# Patient Record
Sex: Female | Born: 1950 | Race: White | Hispanic: No | Marital: Single | Smoking: Never smoker
Health system: Southern US, Community
[De-identification: ages and names within clinical notes are randomized; demographics above are authoritative.]

## PROBLEM LIST (undated history)

## (undated) DIAGNOSIS — E559 Vitamin D deficiency, unspecified: Secondary | ICD-10-CM

## (undated) DIAGNOSIS — M5134 Other intervertebral disc degeneration, thoracic region: Secondary | ICD-10-CM

## (undated) DIAGNOSIS — M5030 Other cervical disc degeneration, unspecified cervical region: Secondary | ICD-10-CM

## (undated) DIAGNOSIS — E669 Obesity, unspecified: Secondary | ICD-10-CM

## (undated) DIAGNOSIS — E785 Hyperlipidemia, unspecified: Secondary | ICD-10-CM

## (undated) DIAGNOSIS — M797 Fibromyalgia: Secondary | ICD-10-CM

## (undated) HISTORY — DX: Obesity, unspecified: E66.9

## (undated) HISTORY — DX: Other intervertebral disc degeneration, thoracic region: M51.34

## (undated) HISTORY — PX: ABDOMINAL HYSTERECTOMY: SHX81

## (undated) HISTORY — PX: SPINE SURGERY: SHX786

## (undated) HISTORY — DX: Hyperlipidemia, unspecified: E78.5

## (undated) HISTORY — DX: Vitamin D deficiency, unspecified: E55.9

## (undated) HISTORY — DX: Other cervical disc degeneration, unspecified cervical region: M50.30

## (undated) HISTORY — DX: Fibromyalgia: M79.7

---

## 1998-02-09 ENCOUNTER — Other Ambulatory Visit: Admission: RE | Admit: 1998-02-09 | Discharge: 1998-02-09 | Payer: Self-pay | Admitting: Gynecology

## 2000-06-02 ENCOUNTER — Other Ambulatory Visit: Admission: RE | Admit: 2000-06-02 | Discharge: 2000-06-02 | Payer: Self-pay | Admitting: Gynecology

## 2001-12-18 ENCOUNTER — Encounter: Payer: Self-pay | Admitting: Rheumatology

## 2001-12-18 ENCOUNTER — Ambulatory Visit (HOSPITAL_COMMUNITY): Admission: RE | Admit: 2001-12-18 | Discharge: 2001-12-18 | Payer: Self-pay | Admitting: Rheumatology

## 2002-02-27 ENCOUNTER — Inpatient Hospital Stay (HOSPITAL_COMMUNITY): Admission: AD | Admit: 2002-02-27 | Discharge: 2002-03-02 | Payer: Self-pay | Admitting: Neurosurgery

## 2002-02-27 ENCOUNTER — Encounter: Payer: Self-pay | Admitting: Neurosurgery

## 2002-06-23 ENCOUNTER — Ambulatory Visit (HOSPITAL_BASED_OUTPATIENT_CLINIC_OR_DEPARTMENT_OTHER): Admission: RE | Admit: 2002-06-23 | Discharge: 2002-06-23 | Payer: Self-pay | Admitting: Orthopedic Surgery

## 2005-11-05 ENCOUNTER — Encounter: Admission: RE | Admit: 2005-11-05 | Discharge: 2005-11-05 | Payer: Self-pay | Admitting: Otolaryngology

## 2005-11-24 ENCOUNTER — Encounter: Admission: RE | Admit: 2005-11-24 | Discharge: 2005-11-24 | Payer: Self-pay | Admitting: Rheumatology

## 2005-12-07 ENCOUNTER — Encounter: Admission: RE | Admit: 2005-12-07 | Discharge: 2005-12-07 | Payer: Self-pay | Admitting: Rheumatology

## 2008-01-03 ENCOUNTER — Encounter: Admission: RE | Admit: 2008-01-03 | Discharge: 2008-01-03 | Payer: Self-pay | Admitting: Rheumatology

## 2008-09-23 ENCOUNTER — Encounter: Admission: RE | Admit: 2008-09-23 | Discharge: 2008-09-23 | Payer: Self-pay | Admitting: Orthopedic Surgery

## 2008-12-07 ENCOUNTER — Encounter: Admission: RE | Admit: 2008-12-07 | Discharge: 2008-12-07 | Payer: Self-pay | Admitting: Orthopedic Surgery

## 2009-03-17 ENCOUNTER — Encounter: Admission: RE | Admit: 2009-03-17 | Discharge: 2009-03-17 | Payer: Self-pay | Admitting: Family Medicine

## 2009-10-25 ENCOUNTER — Encounter (INDEPENDENT_AMBULATORY_CARE_PROVIDER_SITE_OTHER): Payer: Self-pay | Admitting: *Deleted

## 2009-11-03 ENCOUNTER — Encounter (INDEPENDENT_AMBULATORY_CARE_PROVIDER_SITE_OTHER): Payer: Self-pay | Admitting: *Deleted

## 2009-11-06 ENCOUNTER — Ambulatory Visit: Payer: Self-pay | Admitting: Gastroenterology

## 2009-11-10 ENCOUNTER — Encounter: Payer: Self-pay | Admitting: Gastroenterology

## 2009-11-21 ENCOUNTER — Ambulatory Visit: Payer: Self-pay | Admitting: Gastroenterology

## 2010-09-12 ENCOUNTER — Encounter
Admission: RE | Admit: 2010-09-12 | Discharge: 2010-09-12 | Payer: Self-pay | Source: Home / Self Care | Attending: Family Medicine | Admitting: Family Medicine

## 2010-09-13 ENCOUNTER — Encounter: Admission: RE | Admit: 2010-09-13 | Payer: Self-pay | Source: Home / Self Care | Admitting: Rheumatology

## 2010-10-07 ENCOUNTER — Encounter: Payer: Self-pay | Admitting: Rheumatology

## 2010-10-16 NOTE — Letter (Signed)
Summary: Previsit letter  [facility]  [address] [place], [place] [address]   Phone: [phone]  Fax: [phone]       10/25/2009 MRN: [id number]  Bridget Hampton [address] [place], [place]  [address]  Dear [name],  Welcome to the Gastroenterology Division at [facility].    You are scheduled to see a nurse for your pre-procedure visit on 11-06-09 at 2:30p.m. on the 3rd floor at [facility], [address]  We ask that you try to arrive at our office 15 minutes prior to your appointment time to allow for check-in.  Your nurse visit will consist of discussing your medical and surgical history, your immediate family medical history, and your medications.    Please bring a complete list of all your medications or, if you prefer, bring the medication bottles and we will list them.  We will need to be aware of both prescribed and over the counter drugs.  We will need to know exact dosage information as well.  If you are on blood thinners (Coumadin, Plavix, Aggrenox, Ticlid, etc.) please call our office today/prior to your appointment, as we need to consult with your physician about holding your medication.   Please be prepared to read and sign documents such as consent forms, a financial agreement, and acknowledgement forms.  If necessary, and with your consent, a friend or relative is welcome to sit-in on the nurse visit with you.  Please bring your insurance card so that we may make a copy of it.  If your insurance requires a referral to see a specialist, please bring your referral form from your primary care physician.  No co-pay is required for this nurse visit.     If you cannot keep your appointment, please call [phone] to cancel or reschedule prior to your appointment date.  This allows [place] the opportunity to schedule an appointment for another patient in need of care.    Thank you for choosing [facility] for your medical needs.  We  appreciate the opportunity to care for you.  Please visit [place] at our website  to learn more about our practice.                     Sincerely.                                                                                                                   The Gastroenterology Division

## 2010-10-16 NOTE — Miscellaneous (Signed)
Summary: Previsit miralax prep  Clinical Lists Changes  Medications: Added new medication of MIRALAX   POWD (POLYETHYLENE GLYCOL 3350) As directed - Signed Added new medication of REGLAN 10 MG  TABS (METOCLOPRAMIDE HCL) As directed - Signed Added new medication of DULCOLAX 5 MG  TBEC (BISACODYL) As directed - Signed Rx of MIRALAX   POWD (POLYETHYLENE GLYCOL 3350) As directed;  #255 Gms x 0;  Signed;  Entered by: Clide Cliff RN;  Authorized by: Meryl Dare MD [facility];  Method used: Electronically to [facility]. #[id number]*, [address] [place], [place]  [address], Ph: [phone], Fax: [phone] Rx of REGLAN 10 MG  TABS (METOCLOPRAMIDE HCL) As directed;  #2 x 0;  Signed;  Entered by: Clide Cliff RN;  Authorized by: Meryl Dare MD [facility];  Method used: Electronically to [facility]. #[id number]*, [address], [place], [place]  [address], Ph: [phone], Fax: [phone] Rx of DULCOLAX 5 MG  TBEC (BISACODYL) As directed;  #4 x 0;  Signed;  Entered by: Clide Cliff RN;  Authorized by: Meryl Dare MD [facility];  Method used: Electronically to [facility]. #[id number]*, [address] [place], [place]  [address], Ph: [phone], Fax: [phone] Observations: Added new observation of ALLERGY REV: Done (11/10/2009 10:55)    Prescriptions: DULCOLAX 5 MG  TBEC (BISACODYL) As directed  #4 x 0   Entered by:   Clide Cliff RN   Authorized by:   Meryl Dare MD [facility]   Signed by:   Clide Cliff RN on 11/10/2009   Method used:   Electronically to        [facility] #[id number]* (retail)       [address] [place], [place]  [address]       Ph: [phone]       Fax: [phone]   RxID:   [id number] REGLAN 10 MG  TABS (METOCLOPRAMIDE HCL) As directed  #2 x 0   Entered by:   Clide Cliff RN   Authorized by:   Meryl Dare MD [facility]   Signed by:   Clide Cliff RN on 11/10/2009   Method used:   Electronically to        [facility] #[id number]* (retail)       [address] [place], [place]  [address]       Ph: [phone]       Fax: [phone]   RxID:   [id number] MIRALAX   POWD (POLYETHYLENE GLYCOL 3350) As directed  #255 Gms x 0   Entered by:   Clide Cliff RN   Authorized by:   Meryl Dare MD [facility]   Signed by:   Clide Cliff RN on 11/10/2009   Method used:   Electronically to        [facility] #[id number]* (retail)       [address] [place], [place]  [address]       Ph: [phone]       Fax: [phone]   RxID:   [id number]

## 2010-10-16 NOTE — Miscellaneous (Signed)
Summary: previsit/rm  Clinical Lists Changes  Medications: Added new medication of MOVIPREP 100 GM  SOLR (PEG-KCL-NACL-NASULF-NA ASC-C) As per prep instructions. - Signed Rx of MOVIPREP 100 GM  SOLR (PEG-KCL-NACL-NASULF-NA ASC-C) As per prep instructions.;  #1 x 0;  Signed;  Entered by: Sherren Kerns RN;  Authorized by: Meryl Dare MD [facility];  Method used: Electronically to [facility]. #[id number]*, [address], [place], [place]  [address], Ph: [phone], Fax: [phone] Observations: Added new observation of ALLERGY REV: Done (11/06/2009 14:11) Added new observation of NKA: T (11/06/2009 14:11)    Prescriptions: MOVIPREP 100 GM  SOLR (PEG-KCL-NACL-NASULF-NA ASC-C) As per prep instructions.  #1 x 0   Entered by:   Sherren Kerns RN   Authorized by:   Meryl Dare MD [facility]   Signed by:   Sherren Kerns RN on 11/06/2009   Method used:   Electronically to        [facility] #[id number]* (retail)       [address] [place], [place]  [address]       Ph: [phone]       Fax: [phone]   RxID:   [id number]

## 2010-10-16 NOTE — Procedures (Signed)
Summary: Colonoscopy  Patient: [name] Note: All result statuses are Final unless otherwise noted.  Tests: (1) Colonoscopy (COL)   COL Colonoscopy           DONE     [facility]     [address]     [place], [place]  [address]           COLONOSCOPY PROCEDURE REPORT           PATIENT:  Bridget Hampton, Bridget Hampton  MR#:  [id number]     BIRTHDATE:  [birth year], 58 yrs. old  GENDER:  female           ENDOSCOPIST:  Judie Petit T. Russella Dar, MD, [facility]           PROCEDURE DATE:  11/21/2009     PROCEDURE:  Colonoscopy 19147     ASA CLASS:  Class II     INDICATIONS:  1) change in bowel habits           MEDICATIONS:   Fentanyl 75 mcg IV, Versed 7 mg IV           DESCRIPTION OF PROCEDURE:   After the risks benefits and     alternatives of the procedure were thoroughly explained, informed     consent was obtained.  Digital rectal exam was performed and     revealed no abnormalities.   The LB [id number]-[id number] endoscope     was introduced through the anus and advanced to the cecum, which     was identified by both the appendix and ileocecal valve, without     limitations.  The quality of the prep was good, using one liter of     MoviPrep and a full Miralax prep.  The instrument was then slowly     withdrawn as the colon was fully examined.     <<PROCEDUREIMAGES>>           FINDINGS:  A normal appearing cecum, ileocecal valve, and     appendiceal orifice were identified. The ascending, hepatic     flexure, transverse, splenic flexure, descending, sigmoid colon,     and rectum appeared unremarkable. Retroflexed views in the rectum     revealed no abnormalities. The time to cecum =  2.75  minutes. The     scope was then withdrawn (time =  13.33  min) from the patient and     the procedure completed.           COMPLICATIONS:  None           ENDOSCOPIC IMPRESSION:     1) Normal colon           RECOMMENDATIONS:     1) Continue current colorectal screening recommendations for     "routine  risk" patients with a repeat colonoscopy in 10 years.           Venita Lick. Russella Dar, MD, [facility]           CC: Ace Gins, MD           n.     Rosalie DoctorVenita Lick. Serria Sloma at 11/21/2009 02:54 PM           Sophronia Simas, [phone]  Note: An exclamation mark (!) indicates a result that was not dispersed into the flowsheet. Document Creation Date: 11/21/2009 2:55 PM _______________________________________________________________________  (1) Order result status: Final Collection or observation date-time: 11/21/2009 14:46 Requested date-time:  Receipt date-time:  Reported date-time:  Referring Physician:  Ordering Physician: Claudette Head [phone]) Specimen Source:  Source: Launa Grill Order Number: [id number] Lab site:   Appended Document: Colonoscopy    Clinical Lists Changes  Observations: Added new observation of COLONNXTDUE: 11/2019 (11/21/2009 16:06)

## 2010-10-16 NOTE — Letter (Signed)
Summary: [facility] Instructions  [name]  [address] [place], [place] [address]   Phone: [phone]  Fax: [phone]       Bridget Hampton "Tanganyika"    1951-01-16    MRN: [id number]        Procedure Day [name]:  Farrell Ours  11/10/09     Arrival Time:  10:00AM     Procedure Time:  11:00AM     Location of Procedure:                    _ X_  [facility] (4th Floor)                        PREPARATION FOR COLONOSCOPY WITH MOVIPREP   Starting 5 days prior to your procedure 11/05/09 today do not eat nuts, seeds, popcorn, corn, beans, peas,  salads, or any raw vegetables.  Do not take any fiber supplements (e.g. Metamucil, Citrucel, and Benefiber).  THE DAY BEFORE YOUR PROCEDURE         DATE: 11/09/09  DAY: THURSDAY  1.  Drink clear liquids the entire day-NO SOLID FOOD  2.  Do not drink anything colored red or purple.  Avoid juices with pulp.  No orange juice.  3.  Drink at least 64 oz. (8 glasses) of fluid/clear liquids during the day to prevent dehydration and help the prep work efficiently.  CLEAR LIQUIDS INCLUDE: Water Jello Ice Popsicles Tea (sugar ok, no milk/cream) Powdered fruit flavored drinks Coffee (sugar ok, no milk/cream) Gatorade Juice: apple, white grape, white cranberry  Lemonade Clear bullion, consomm, broth Carbonated beverages (any kind) Strained chicken noodle soup Hard Candy                             4.  In the morning, mix first dose of MoviPrep solution:    Empty 1 Pouch A and 1 Pouch B into the disposable container    Add lukewarm drinking water to the top line of the container. Mix to dissolve    Refrigerate (mixed solution should be used within 24 hrs)  5.  Begin drinking the prep at 5:00 p.m. The MoviPrep container is divided by 4 marks.   Every 15 minutes drink the solution down to the next mark (approximately 8 oz) until the full liter is complete.   6.  Follow completed prep with 16 oz of clear liquid of your  choice (Nothing red or purple).  Continue to drink clear liquids until bedtime.  7.  Before going to bed, mix second dose of MoviPrep solution:    Empty 1 Pouch A and 1 Pouch B into the disposable container    Add lukewarm drinking water to the top line of the container. Mix to dissolve    Refrigerate  THE DAY OF YOUR PROCEDURE      DATE: 11/10/09  DAY: FRIDAY  Beginning at 6:00AM (5 hours before procedure):         1. Every 15 minutes, drink the solution down to the next mark (approx 8 oz) until the full liter is complete.  2. Follow completed prep with 16 oz. of clear liquid of your choice.    3. You may drink clear liquids until 9:00AM (2 HOURS BEFORE PROCEDURE).   MEDICATION INSTRUCTIONS  Unless otherwise instructed, you should take regular prescription medications with a small sip of water   as early as possible  the morning of your procedure.    Additional medication instructions:  n/a         OTHER INSTRUCTIONS  You will need a responsible adult at least 60 years of age to accompany you and drive you home.   This person must remain in the waiting room during your procedure.  Wear loose fitting clothing that is easily removed.  Leave jewelry and other valuables at home.  However, you may wish to bring a book to read or  an iPod/MP3 player to listen to music as you wait for your procedure to start.  Remove all body piercing jewelry and leave at home.  Total time from sign-in until discharge is approximately 2-3 hours.  You should go home directly after your procedure and rest.  You can resume normal activities the  day after your procedure.  The day of your procedure you should not:   Drive   Make legal decisions   Operate machinery   Drink alcohol   Return to work  You will receive specific instructions about eating, activities and medications before you leave.    The above instructions have been reviewed and explained to me by  Sherren Kerns RN   November 06, 2009 2:37 PM      I fully understand and can verbalize these instructions _____________________________ Date _________

## 2011-01-08 ENCOUNTER — Other Ambulatory Visit: Payer: Self-pay | Admitting: Neurosurgery

## 2011-01-08 DIAGNOSIS — M542 Cervicalgia: Secondary | ICD-10-CM

## 2011-01-10 ENCOUNTER — Ambulatory Visit
Admission: RE | Admit: 2011-01-10 | Discharge: 2011-01-10 | Disposition: A | Discharge status: Home / Self Care | Payer: PRIVATE HEALTH INSURANCE | Source: Ambulatory Visit | Attending: Neurosurgery | Admitting: Neurosurgery

## 2011-01-10 DIAGNOSIS — M542 Cervicalgia: Secondary | ICD-10-CM

## 2011-02-01 NOTE — Op Note (Signed)
[facility]  Patient:    Bridget Hampton, Bridget Hampton Visit Number: [id number] MRN: [id number]          Service Type: SUR Location: [id number] Attending Physician:  Josie Saunders Dictated by:   Danae Orleans Venetia Maxon, M.D. Proc. 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  POSTOPERATIVE DIAGNOSIS:  Herniated cervical disk with cervical spondylosis and degenerative disk disease and radiculopathy at the C5-6 level.  OPERATION PERFORMED:  Anterior cervical diskectomy and fusion, C5-6 with allograft, bone graft and anterior cervical plate.  SURGEON:  Danae Orleans. Venetia Maxon, M.D.  ASSISTANT:  Clydene Fake, M.D.  ANESTHESIA:  General endotracheal.  ESTIMATED BLOOD LOSS:  Minimal.  COMPLICATIONS:  None.  DISPOSITION:  Recovery.  INDICATIONS FOR PROCEDURE:  The patient is a 60 year old woman with a herniated disk which has significantly degenerated with bilateral spondylitic foraminal narrowing at the C5-6 level.  It was elected to take her to surgery for anterior cervical diskectomy and fusion.  DESCRIPTION OF PROCEDURE:  The patient was brought to the operating room. Following satisfactory and uncomplicated induction of general endotracheal anesthesia and placement of intravenous lines, the patient was placed in supine position on the operating table.  Her neck was placed in slight extension.  She was placed on horseshoe head holder in 10 pounds of halter traction.  Her anterior neck was then prepped and draped in the usual sterile fashion.  The area of planned incision was infiltrated with 0.25% Marcaine and 0.5% lidocaine with 1:200,000 epinephrine.  Incision was made from the midline to the anterior border of the sternocleidomastoid muscle.  Through platysma layer,  subplatysmal dissection was performed and then blunt dissection was performed keeping the carotid sheath lateral and trachea and esophagus medial exposing the anterior cervical spine.  The disk space which was felt to correspond to the C5-6 level was identified and the spinal needle was placed at this level. Intraoperative x-ray confirmed correct level.  Subsequently the longus colli muscles were taken down bilaterally from C5 to C6 using electrocautery and Key elevator and the Shadowline retractor was placed.  The disk space was incised.  Ventral osteophytes were removed with Leksell rongeur.  The interspace was severely degenerated.  There was minimal remaining disk.  The disk space spreaders were placed and subsequently the end plates were decorticated using Anspach drill and A2 equivalent bur. The large uncinate spurs were drilled down bilaterally and the C6 nerve roots were decompressed as they extended out the neural foramina.  This was done with removal of the posterior longitudinal ligament and remaining prominent osteophytes.  The spinal cord dura and C6 nerve roots were well decompressed. Hemostasis was assured with Gelfoam soaked in thrombin and 8 mm tricortical iliac crest bone graft was then cut to a depth of 12 mm and inserted in the interspace after sizing and prior to choosing the appropriate thickness bone graft.  The halter traction was then removed using reflex anterior cervical plate.  An 18 mm plate was affixed to the anterior cervical spine with two 12 mm screws at C5 and two 12 mm screws at C6.  All screws had excellent purchase and engaged the plate well.  Final x-ray confirmed positioning of  bone graft and anterior cervical plate.  The wound was copiously irrigated with Bacitracin saline after removal of the retractor.  Hemostasis was assured.  Platysma layer was closed with 3-0 Vicryl sutures and skin edges were reapproximated with running 4-0 Vicryl suture  subcuticular stitch.  The wound was dressed with Benzoin and Steri-Strips and Telfa gauze and tape.  The patient was extubated in the operating room and taken to the recovery room in stable and satisfactory condition having tolerated the procedure well. Dictated by:   Danae Orleans Venetia Maxon, M.D. Attending Physician:  Josie Saunders DD:  02/27/02 TD:  03/01/02 Job: [id number]

## 2011-02-01 NOTE — Op Note (Signed)
NAME:  Bridget Hampton, Bridget Hampton                          ACCOUNT NO.:  [id number]   MEDICAL RECORD NO.:  [id number]                   PATIENT TYPE:  AMB   LOCATION:  DSC                                  FACILITY:  [facility]   PHYSICIAN:  Mila Homer. Sherlean Foot, M.D.              DATE OF BIRTH:  [birth year]   DATE OF PROCEDURE:  06/23/2002  DATE OF DISCHARGE:                                 OPERATIVE REPORT   PREOPERATIVE DIAGNOSIS:  Right shoulder impingement syndrome.   POSTOPERATIVE DIAGNOSIS:  Right shoulder impingement syndrome.   PROCEDURE:  Right shoulder arthroscopy with subacromial decompression.   SURGEON:  Mila Homer. Sherlean Foot, M.D.   COMPLICATIONS:  None.   DRAINS:  None.   INDICATION FOR PROCEDURE:  The patient is a 60 year old white female status  post conservative measures for shoulder impingement syndrome, MRI evidence  of partial-thickness tearing of the rotator cuff.  Informed consent was  obtained.   DESCRIPTION OF PROCEDURE:  The patient was placed supine on the operating  room table, administered general LMA anesthesia after an interscalene block  in the preanesthesia holding area.  The right shoulder was prepped and  draped in the usual sterile fashion.  The patient was in the beach chair  position.  A posterior portal was created with a #11 blade, blunt trocar,  and cannula.  Glenohumeral arthroscopy revealed no chondromalacia in the  glenohumeral joint, normal labrum, normal biceps anchor, superior and  inferior middle glenohumeral ligament.  The bare area of the humerus showed  an intact rotator cuff but some minor partial-thickness tearing of the  supraspinatus insertion.  At this point I went into the subacromial space  from posterior and made a direct lateral portal with a #11 blade, blunt  trocar, and cannula.  I performed a bursectomy, an anterior acromioplasty,  and a CA ligament release.  Once I had done this, I evaluated the rotator  cuff.  There was a significant  amount of fraying and partial-thickness  tearing but nothing close to a full-thickness tear.  The clavicle did not  seem to have any large osteophytes, so I went and did the acromioplasty over  to the clavicle but did not violate the [facility] joint.  The 30-30 test showed  that adequate resection of bone had been performed.  At this point I  evacuated the subacromial space, closed with interrupted 4-0 nylon sutures,  dressed with Adaptic, 4 x 4's, and ABDs and two-inch silk tape and a simple  sling.  The patient tolerated her procedure well.                                               Mila Homer. Sherlean Foot, M.D.    SDL/MEDQ  D:  06/23/2002  T:  06/24/2002  Job:  [id number]  

## 2011-02-01 NOTE — Discharge Summary (Signed)
   NAME:  [name], [name] NO.:  [id number]   MEDICAL RECORD NO.:  [id number]                   PATIENT TYPE:   LOCATION:                                       FACILITY:   PHYSICIAN:  Danae Orleans. Venetia Maxon, M.D.               DATE OF BIRTH:  [birth year]   DATE OF ADMISSION:  02/27/2002  DATE OF DISCHARGE:  03/02/2002                                 DISCHARGE SUMMARY   REASON FOR ADMISSION:  Herniated cervical disk.   ADDITIONAL DIAGNOSES:  1. Urinary retention.  2. Cervical disk degenerative disease.  3. Esophageal reflux.  4. Cervical spondylosis.   FINAL DIAGNOSES:  1. Herniated cervical disk.  2. Urinary retention.  3. Cervical disk degenerative disease.  4. Esophageal reflux.  5. Cervical spondylosis.   HISTORY OF ILLNESS AND HOSPITAL COURSE:  The patient is a 60 year old woman  with a herniated cervical disk and cervical radiculopathy at C5-6 level.  She was admitted to the hospital on the same-day-as-procedure basis on February 27, 2002 and at that point, underwent uncomplicated anterior cervical  diskectomy and fusion.  Postoperatively, she had some difficulty with  urinary retention, had abdominal distention and had a catheterization for  900 cc of urine.  She was kept in the hospital with bladder rest and  eventually was doing well on the 17th and was discharged at that point,  voiding without difficulty and with instructions to follow up in four weeks  with discharge medications of Vicodin and Valium, her condition improved and  final diagnoses same.                                                 Danae Orleans. Venetia Maxon, M.D.    JDS/MEDQ  D:  07/16/2002  T:  07/18/2002  Job:  [id number]

## 2013-05-18 ENCOUNTER — Other Ambulatory Visit (HOSPITAL_BASED_OUTPATIENT_CLINIC_OR_DEPARTMENT_OTHER): Payer: Self-pay | Admitting: Rheumatology

## 2013-05-18 DIAGNOSIS — IMO0002 Reserved for concepts with insufficient information to code with codable children: Secondary | ICD-10-CM

## 2013-05-22 ENCOUNTER — Ambulatory Visit (HOSPITAL_BASED_OUTPATIENT_CLINIC_OR_DEPARTMENT_OTHER)
Admission: RE | Admit: 2013-05-22 | Discharge: 2013-05-22 | Disposition: A | Discharge status: Home / Self Care | Payer: 59 | Source: Ambulatory Visit | Attending: Rheumatology | Admitting: Rheumatology

## 2013-05-22 DIAGNOSIS — M545 Low back pain, unspecified: Secondary | ICD-10-CM | POA: Insufficient documentation

## 2013-05-22 DIAGNOSIS — M5126 Other intervertebral disc displacement, lumbar region: Secondary | ICD-10-CM | POA: Insufficient documentation

## 2013-05-22 DIAGNOSIS — IMO0002 Reserved for concepts with insufficient information to code with codable children: Secondary | ICD-10-CM

## 2013-05-22 DIAGNOSIS — R209 Unspecified disturbances of skin sensation: Secondary | ICD-10-CM | POA: Insufficient documentation

## 2013-05-22 DIAGNOSIS — M4120 Other idiopathic scoliosis, site unspecified: Secondary | ICD-10-CM | POA: Insufficient documentation

## 2013-05-22 DIAGNOSIS — R29898 Other symptoms and signs involving the musculoskeletal system: Secondary | ICD-10-CM | POA: Insufficient documentation

## 2014-10-25 ENCOUNTER — Other Ambulatory Visit (HOSPITAL_COMMUNITY): Payer: Self-pay | Admitting: Rheumatology

## 2014-10-25 DIAGNOSIS — M545 Low back pain: Secondary | ICD-10-CM

## 2015-11-16 ENCOUNTER — Other Ambulatory Visit: Payer: Self-pay | Admitting: Orthopedic Surgery

## 2015-11-16 DIAGNOSIS — M542 Cervicalgia: Secondary | ICD-10-CM

## 2015-11-29 ENCOUNTER — Other Ambulatory Visit: Payer: Self-pay

## 2015-12-09 ENCOUNTER — Ambulatory Visit
Admission: RE | Admit: 2015-12-09 | Discharge: 2015-12-09 | Disposition: A | Discharge status: Home / Self Care | Payer: 59 | Source: Ambulatory Visit | Attending: Orthopedic Surgery | Admitting: Orthopedic Surgery

## 2015-12-09 DIAGNOSIS — M542 Cervicalgia: Secondary | ICD-10-CM

## 2015-12-16 ENCOUNTER — Ambulatory Visit
Admission: RE | Admit: 2015-12-16 | Discharge: 2015-12-16 | Disposition: A | Discharge status: Home / Self Care | Payer: 59 | Source: Ambulatory Visit | Attending: Orthopedic Surgery | Admitting: Orthopedic Surgery

## 2016-01-17 ENCOUNTER — Encounter: Payer: Self-pay | Admitting: Gastroenterology

## 2016-11-04 ENCOUNTER — Telehealth: Payer: Self-pay | Admitting: Adult Health

## 2016-11-04 ENCOUNTER — Encounter: Payer: Self-pay | Admitting: Adult Health

## 2016-11-04 ENCOUNTER — Ambulatory Visit (INDEPENDENT_AMBULATORY_CARE_PROVIDER_SITE_OTHER): Payer: 59 | Admitting: Adult Health

## 2016-11-04 DIAGNOSIS — E782 Mixed hyperlipidemia: Secondary | ICD-10-CM | POA: Diagnosis not present

## 2016-11-04 DIAGNOSIS — M549 Dorsalgia, unspecified: Secondary | ICD-10-CM | POA: Insufficient documentation

## 2016-11-04 DIAGNOSIS — R42 Dizziness and giddiness: Secondary | ICD-10-CM | POA: Insufficient documentation

## 2016-11-04 DIAGNOSIS — E559 Vitamin D deficiency, unspecified: Secondary | ICD-10-CM

## 2016-11-04 DIAGNOSIS — R112 Nausea with vomiting, unspecified: Secondary | ICD-10-CM | POA: Insufficient documentation

## 2016-11-04 DIAGNOSIS — E785 Hyperlipidemia, unspecified: Secondary | ICD-10-CM | POA: Insufficient documentation

## 2016-11-04 DIAGNOSIS — M797 Fibromyalgia: Secondary | ICD-10-CM

## 2016-11-04 DIAGNOSIS — M5430 Sciatica, unspecified side: Secondary | ICD-10-CM | POA: Insufficient documentation

## 2016-11-04 DIAGNOSIS — M546 Pain in thoracic spine: Secondary | ICD-10-CM

## 2016-11-04 DIAGNOSIS — M5432 Sciatica, left side: Secondary | ICD-10-CM

## 2016-11-04 DIAGNOSIS — Z6836 Body mass index (BMI) 36.0-36.9, adult: Secondary | ICD-10-CM

## 2016-11-04 DIAGNOSIS — M5431 Sciatica, right side: Secondary | ICD-10-CM

## 2016-11-04 MED ORDER — ONDANSETRON HCL 8 MG PO TABS: 4.0000 mg | ORAL_TABLET | Freq: Three times a day (TID) | ORAL | 0 refills | Status: AC | PRN

## 2016-11-04 NOTE — Assessment & Plan Note (Signed)
Reduce saturated fat intake. 

## 2016-11-04 NOTE — Assessment & Plan Note (Signed)
Increase fluids and rest. Provided work excuse for 48 hrs.

## 2016-11-04 NOTE — Patient Instructions (Addendum)
Food Choices to Help Relieve Diarrhea, Adult When you have diarrhea, the foods you eat and your eating habits are very important. Choosing the right foods and drinks can help relieve diarrhea. Also, because diarrhea can last up to 7 days, you need to replace lost fluids and electrolytes (such as sodium, potassium, and chloride) in order to help prevent dehydration. What general guidelines do I need to follow?  Slowly drink 1 cup (8 oz) of fluid for each episode of diarrhea. If you are getting enough fluid, your urine will be clear or pale yellow.  Eat starchy foods. Some good choices include white rice, white toast, pasta, low-fiber cereal, baked potatoes (without the skin), saltine crackers, and bagels.  Avoid large servings of any cooked vegetables.  Limit fruit to two servings per day. A serving is  cup or 1 small piece.  Choose foods with less than 2 g of fiber per serving.  Limit fats to less than 8 tsp (38 g) per day.  Avoid fried foods.  Eat foods that have probiotics in them. Probiotics can be found in certain dairy products.  Avoid foods and beverages that may increase the speed at which food moves through the stomach and intestines (gastrointestinal tract). Things to avoid include:  High-fiber foods, such as dried fruit, raw fruits and vegetables, nuts, seeds, and whole grain foods.  Spicy foods and high-fat foods.  Foods and beverages sweetened with high-fructose corn syrup, honey, or sugar alcohols such as xylitol, sorbitol, and mannitol. What foods are recommended? Grains  White rice. White, French, or pita breads (fresh or toasted), including plain rolls, buns, or bagels. White pasta. Saltine, soda, or graham crackers. Pretzels. Low-fiber cereal. Cooked cereals made with water (such as cornmeal, farina, or cream cereals). Plain muffins. Matzo. Melba toast. Zwieback. Vegetables  Potatoes (without the skin). Strained tomato and vegetable juices. Most well-cooked and canned  vegetables without seeds. Tender lettuce. Fruits  Cooked or canned applesauce, apricots, cherries, fruit cocktail, grapefruit, peaches, pears, or plums. Fresh bananas, apples without skin, cherries, grapes, cantaloupe, grapefruit, peaches, oranges, or plums. Meat and Other Protein Products  Baked or boiled chicken. Eggs. Tofu. Fish. Seafood. Smooth peanut butter. Ground or well-cooked tender beef, ham, veal, lamb, pork, or poultry. Dairy  Plain yogurt, kefir, and unsweetened liquid yogurt. Lactose-free milk, buttermilk, or soy milk. Plain hard cheese. Beverages  Sport drinks. Clear broths. Diluted fruit juices (except prune). Regular, caffeine-free sodas such as ginger ale. Water. Decaffeinated teas. Oral rehydration solutions. Sugar-free beverages not sweetened with sugar alcohols. Other  Bouillon, broth, or soups made from recommended foods. The items listed above may not be a complete list of recommended foods or beverages. Contact your dietitian for more options.  What foods are not recommended? Grains  Whole grain, whole wheat, bran, or rye breads, rolls, pastas, crackers, and cereals. Wild or brown rice. Cereals that contain more than 2 g of fiber per serving. Corn tortillas or taco shells. Cooked or dry oatmeal. Granola. Popcorn. Vegetables  Raw vegetables. Cabbage, broccoli, Brussels sprouts, artichokes, baked beans, beet greens, corn, kale, legumes, peas, sweet potatoes, and yams. Potato skins. Cooked spinach and cabbage. Fruits  Dried fruit, including raisins and dates. Raw fruits. Stewed or dried prunes. Fresh apples with skin, apricots, mangoes, pears, raspberries, and strawberries. Meat and Other Protein Products  Chunky peanut butter. Nuts and seeds. Beans and lentils. Bacon. Dairy  High-fat cheeses. Milk, chocolate milk, and beverages made with milk, such as milk shakes. Cream. Ice cream. Sweets and Desserts    Sweet rolls, doughnuts, and sweet breads. Pancakes and waffles. Fats  and Oils  Butter. Cream sauces. Margarine. Salad oils. Plain salad dressings. Olives. Avocados. Beverages  Caffeinated beverages (such as coffee, tea, soda, or energy drinks). Alcoholic beverages. Fruit juices with pulp. Prune juice. Soft drinks sweetened with high-fructose corn syrup or sugar alcohols. Other  Coconut. Hot sauce. Chili powder. Mayonnaise. Gravy. Cream-based or milk-based soups. The items listed above may not be a complete list of foods and beverages to avoid. Contact your dietitian for more information.  What should I do if I become dehydrated? Diarrhea can sometimes lead to dehydration. Signs of dehydration include dark urine and dry mouth and skin. If you think you are dehydrated, you should rehydrate with an oral rehydration solution. These solutions can be purchased at pharmacies, retail stores, or online. Drink -1 cup (120-240 mL) of oral rehydration solution each time you have an episode of diarrhea. If drinking this amount makes your diarrhea worse, try drinking smaller amounts more often. For example, drink 1-3 tsp (5-15 mL) every 5-10 minutes. A general rule for staying hydrated is to drink 1-2 L of fluid per day. Talk to your health care provider about the specific amount you should be drinking each day. Drink enough fluids to keep your urine clear or pale yellow. This information is not intended to replace advice given to you by your health care provider. Make sure you discuss any questions you have with your health care provider. Document Released: 11/23/2003 Document Revised: 02/08/2016 Document Reviewed: 07/26/2013 Elsevier Interactive Patient Education  2017 Else[employer] Inc. Nausea, Adult Nausea is the feeling of an upset stomach or having to vomit. Nausea on its own is not usually a serious concern, but it may be an early sign of a more serious medical problem. As nausea gets worse, it can lead to vomiting. If vomiting develops, or if you are not able to drink enough  fluids, you are at risk of becoming dehydrated. Dehydration can make you tired and thirsty, cause you to have a dry mouth, and decrease how often you urinate. Older adults and people with other diseases or a weak immune system are at higher risk for dehydration. The main goals of treating your nausea are:  To limit repeated nausea episodes.  To prevent vomiting and dehydration. Follow these instructions at home: Follow instructions from your health care provider about how to care for yourself at home. Eating and drinking Follow these recommendations as told by your health care provider:  Take an oral rehydration solution (ORS). This is a drink that is sold at pharmacies and retail stores.  Drink clear fluids in small amounts as you are able. Clear fluids include water, ice chips, diluted fruit juice, and low-calorie sports drinks.  Eat bland, easy-to-digest foods in small amounts as you are able. These foods include bananas, applesauce, rice, lean meats, toast, and crackers.  Avoid drinking fluids that contain a lot of sugar or caffeine, such as energy drinks, sports drinks, and soda.  Avoid alcohol.  Avoid spicy or fatty foods. General instructions  Drink enough fluid to keep your urine clear or pale yellow.  Wash your hands often. If soap and water are not available, use hand sanitizer.  Make sure that all people in your household wash their hands well and often.  Rest at home while you recover.  Take over-the-counter and prescription medicines only as told by your health care provider.  Breathe slowly and deeply when you feel nauseous.  Watch your  condition for any changes.  Keep all follow-up visits as told by your health care provider. This is important. Contact a health care provider if:  You have a headache.  You have new symptoms.  Your nausea gets worse.  You have a fever.  You feel light-headed or dizzy.  You vomit.  You cannot keep fluids down. Get help  right away if:  You have pain in your chest, neck, arm, or jaw.  You feel extremely weak or you faint.  You have vomit that is bright red or looks like coffee grounds.  You have bloody or black stools or stools that look like tar.  You have a severe headache, a stiff neck, or both.  You have severe pain, cramping, or bloating in your abdomen.  You have a rash.  You have difficulty breathing or are breathing very quickly.  Your heart is beating very quickly.  Your skin feels cold and clammy.  You feel confused.  You have pain when you urinate.  You have signs of dehydration, such as:  Dark urine, very little, or no urine.  Cracked lips.  Dry mouth.  Sunken eyes.  Sleepiness.  Weakness. These symptoms may represent a serious problem that is an emergency. Do not wait to see if the symptoms will go away. Get medical help right away. Call your local emergency services (911 in the [place] Do not drive yourself to the hospital.  This information is not intended to replace advice given to you by your health care provider. Make sure you discuss any questions you have with your health care provider. Document Released: 10/10/2004 Document Revised: 02/05/2016 Document Reviewed: 05/09/2015 Elsevier Interactive Patient Education  2017 Elsevier Inc.  Rest, increase fluids and easily digestable foods. Use Ondansetron every 8 hrs as needed for nausea/vomiting. Please remain home for 48 hrs. Please return Thursday am for fasting labs and let us know how you are feeling. If sx's persist, GI referral will be placed. For regular Follow-up, once a year or sooner if needed.

## 2016-11-04 NOTE — Assessment & Plan Note (Signed)
Reduce saturated fat/sugar/CHO intake. Increase water/fruits/vegetable intake. Increase regular exercise.

## 2016-11-04 NOTE — Assessment & Plan Note (Signed)
Continue with Fibromyalgia specialist as directed.

## 2016-11-04 NOTE — Telephone Encounter (Signed)
Liza from  [facility] clld to say they need Rx prescription for Zocor sent to them for Toll BrothersJoann Ruffino. --glh

## 2016-11-04 NOTE — Progress Notes (Signed)
Subjective:    Patient ID: Bridget Hampton, female    DOB: 24-Feb-1951, 66 y.o.   MRN: [id number]  HPI:  Bridget Hampton presents to establish a new pt.  She has hx of back pain, bil sciatica, fibromyalgia, hyperlipidemia, and obesity.   She reports that 1 month ago thoracic back pain that radiates to both arms began.  She denies acute trauma prior to onset of pain, that is a constant dull ache that is 6/10-9/10.  She reports that's applying biofreeze provide minimal, temporary relief.   She also reports new onset of fever/N/V/D that started about 5 days ago. She denies recent travel outside [place] or eating anything unusual prior to GI upset.  She feels dizzy and believes it is r/t to her inability to keep "anything down".  She denies CP/dyspnea at rest, however will get "winded when working" at [employer]  Also her right eye became reddened and tender to the touch over the weekend.She has not seen PCP in years and only medical care recently is bi-annual visits with a Fibromyalgia specialist.   Patient Care Team    Relationship Specialty Notifications Start End  Malon Kindle, NP PCP - General Family Medicine  10/07/16     Patient Active Problem List   Diagnosis Date Noted  . Nausea & vomiting 11/04/2016  . Dizzinesses 11/04/2016  . Back pain 11/04/2016  . Sciatica 11/04/2016  . BMI 36.0-36.9,adult 11/04/2016  . Vitamin D deficiency   . Hyperlipidemia   . Fibromyalgia      Past Medical History:  Diagnosis Date  . Fibromyalgia   . Hyperlipidemia   . Vitamin D deficiency      Past Surgical History:  Procedure Laterality Date  . ABDOMINAL HYSTERECTOMY    . SPINE SURGERY     2015     Family History  Problem Relation Age of Onset  . Congestive Heart Failure Mother   . Valvular heart disease Mother   . Alcohol abuse Father   . Cancer Sister     Breast and Colon  . Cancer Maternal Uncle     lung  . Heart attack Maternal Grandmother   . Stroke Maternal Grandfather      History    Drug Use No     History  Alcohol Use No     History  Smoking Status  . Never Smoker  Smokeless Tobacco  . Never Used     Outpatient Encounter Prescriptions as of 11/04/2016  Medication Sig  . acetaminophen (TYLENOL) 325 MG tablet Take 650 mg by mouth every 6 (six) hours as needed.  Marland Kitchen ibuprofen (ADVIL,MOTRIN) 200 MG tablet Take 200 mg by mouth every 8 (eight) hours as needed.  . Menthol, Topical Analgesic, (BIOFREEZE EX) Apply topically.  . traMADol (ULTRAM) 50 MG tablet Take 50 mg by mouth every 6 (six) hours as needed.  . ondansetron (ZOFRAN) 8 MG tablet Take 0.5 tablets (4 mg total) by mouth every 8 (eight) hours as needed for nausea or vomiting.   No facility-administered encounter medications on file as of 11/04/2016.     Allergies: Patient has no known allergies.  Body mass index is 36.85 kg/m.  Blood pressure 112/74, pulse 76, resp. rate 16, height 5' 3.75" (1.619 m), weight 213 lb (96.6 kg), SpO2 98 %.     Review of Systems  Constitutional: Positive for activity change, appetite change, fatigue and fever. Negative for chills, diaphoresis and unexpected weight change.  HENT: Negative for congestion and postnasal drip.  Eyes: Positive for pain and redness. Negative for visual disturbance.       Right Eye-reddened, tender to the touch that began over weekend. She does not wear contact lenses.  Respiratory: Positive for shortness of breath. Negative for cough, wheezing and stridor.        Dyspnea with exertion.  Denies tobacco use.  Cardiovascular: Negative for chest pain, palpitations and leg swelling.  Gastrointestinal: Positive for diarrhea, nausea and vomiting. Negative for abdominal distention, abdominal pain, blood in stool, constipation and rectal pain.  Endocrine: Negative for cold intolerance, heat intolerance, polydipsia, polyphagia and polyuria.  Genitourinary: Negative for difficulty urinating and flank pain.  Musculoskeletal: Positive for  arthralgias, back pain, gait problem, neck pain and neck stiffness. Negative for joint swelling.  Skin: Negative for color change, pallor, rash and wound.  Neurological: Positive for dizziness. Negative for tremors, weakness and headaches.       Objective:   Physical Exam  Constitutional: She is oriented to person, place, and time. She appears well-developed and well-nourished.  Appears dizzy, however steady.  HENT:  Head: Normocephalic and atraumatic.  Right Ear: External ear normal.  Left Ear: External ear normal.  Eyes: Lids are normal. Pupils are equal, round, and reactive to light. Right eye exhibits no exudate and no hordeolum. No foreign body present in the right eye. Right conjunctiva has a hemorrhage. Left conjunctiva has no hemorrhage. Right eye exhibits normal extraocular motion and no nystagmus. Left eye exhibits normal extraocular motion and no nystagmus.    Neck: Normal range of motion. Neck supple.  Cardiovascular: Normal rate, regular rhythm, normal heart sounds and intact distal pulses.   Pulmonary/Chest: Effort normal and breath sounds normal. No respiratory distress. She has no wheezes. She has no rales. She exhibits no tenderness.  Abdominal: Soft. Bowel sounds are normal. She exhibits no distension and no mass. There is no hepatosplenomegaly. There is tenderness in the right upper quadrant, left upper quadrant and left lower quadrant. There is no rigidity, no rebound, no guarding, no CVA tenderness, no tenderness at McBurney's point and negative Murphy's sign. No hernia.  Musculoskeletal: Normal range of motion. She exhibits tenderness.       Cervical back: She exhibits tenderness.       Thoracic back: She exhibits tenderness.       Lumbar back: She exhibits tenderness.       Right upper arm: She exhibits tenderness.       Left upper arm: She exhibits tenderness.  Lymphadenopathy:    She has no cervical adenopathy.  Neurological: She is alert and oriented to person,  place, and time. She has normal reflexes. No cranial nerve deficit. Coordination normal.  Skin: Skin is warm and dry. No rash noted. She is not diaphoretic. No erythema. No pallor.  Psychiatric: She has a normal mood and affect. Her behavior is normal. Judgment and thought content normal.  Nursing note and vitals reviewed.         Assessment & Plan:   1. Vitamin D deficiency   2. Mixed hyperlipidemia   3. Fibromyalgia   4. Nausea and vomiting, intractability of vomiting not specified, unspecified vomiting type   5. Dizzinesses   6. Bilateral thoracic back pain, unspecified chronicity   7. Bilateral sciatica   8. BMI 36.0-36.9,adult     Nausea & vomiting Ondansetron 8mg  Q8H PRN N/V. BRAT diet information provided. Sip fluids. Please check in with [place] Thursday and let us know how you are feeling. If sx's persist >  1 week, then likely referral to GI.  Hyperlipidemia Reduce saturated fat intake.  Dizzinesses Increase fluids and rest. Provided work excuse for 48 hrs.  Fibromyalgia Continue with Fibromyalgia specialist as directed.  BMI 36.0-36.9,adult Reduce saturated fat/sugar/CHO intake. Increase water/fruits/vegetable intake. Increase regular exercise.     FOLLOW-UP:  Return in about 1 year (around 11/04/2017) for Regular Follow Up.

## 2016-11-04 NOTE — Assessment & Plan Note (Addendum)
Ondansetron 8mg  Q8H PRN N/V. BRAT diet information provided. Sip fluids. Please check in with us Thursday and let us know how you are feeling. If sx's persist > 1 week, then likely referral to GI.

## 2016-11-05 NOTE — Telephone Encounter (Signed)
Tried to call patient for more information. I was unable to leave message, voicemail is full.

## 2016-11-06 NOTE — Telephone Encounter (Signed)
Unable to leave a message,mailbox is full. 

## 2016-11-07 ENCOUNTER — Other Ambulatory Visit (INDEPENDENT_AMBULATORY_CARE_PROVIDER_SITE_OTHER): Payer: 59

## 2016-11-07 ENCOUNTER — Other Ambulatory Visit: Payer: Self-pay

## 2016-11-07 DIAGNOSIS — R42 Dizziness and giddiness: Secondary | ICD-10-CM | POA: Diagnosis not present

## 2016-11-07 DIAGNOSIS — E559 Vitamin D deficiency, unspecified: Secondary | ICD-10-CM

## 2016-11-07 DIAGNOSIS — Z1321 Encounter for screening for nutritional disorder: Secondary | ICD-10-CM

## 2016-11-07 DIAGNOSIS — E7849 Other hyperlipidemia: Secondary | ICD-10-CM

## 2016-11-08 LAB — LIPID PANEL
CHOLESTEROL TOTAL: 225 mg/dL — AB (ref 100–199)
Chol/HDL Ratio: 7.5 — ABNORMAL HIGH (ref 0.0–4.4)
HDL: 30 mg/dL — AB (ref >39)
LDL CALC: 144 — AB (ref 0–99)
TRIGLYCERIDES: 253 mg/dL — AB (ref 0–149)
VLDL CHOLESTEROL CAL: 51 — AB (ref 5–40)

## 2016-11-08 LAB — CBC WITH DIFFERENTIAL/PLATELET
BASOS: 0 %
Basophils Absolute: 0 10*3/uL (ref 0.0–0.2)
EOS (ABSOLUTE): 0.2 10*3/uL (ref 0.0–0.4)
EOS: 3 %
Hematocrit: 40.2 % (ref 34.0–46.6)
Hemoglobin: 13.6 g/dL (ref 11.1–15.9)
IMMATURE GRANS (ABS): 0 10*3/uL (ref 0.0–0.1)
IMMATURE GRANULOCYTES: 0 %
LYMPHS: 32 %
Lymphocytes Absolute: 1.6 10*3/uL (ref 0.7–3.1)
MCH: 29.3 pg (ref 26.6–33.0)
MCHC: 33.8 g/dL (ref 31.5–35.7)
MCV: 87 fL (ref 79–97)
Monocytes Absolute: 0.6 10*3/uL (ref 0.1–0.9)
Monocytes: 12 %
NEUTROS PCT: 53 %
Neutrophils Absolute: 2.7 10*3/uL (ref 1.4–7.0)
PLATELETS: 234 10*3/uL (ref 150–379)
RBC: 4.64 x10E6/uL (ref 3.77–5.28)
RDW: 14 % (ref 12.3–15.4)
WBC: 5.1 10*3/uL (ref 3.4–10.8)

## 2016-11-08 LAB — COMPREHENSIVE METABOLIC PANEL
A/G RATIO: 1.4 (ref 1.2–2.2)
ALT: 13 IU/L (ref 0–32)
AST: 14 IU/L (ref 0–40)
Albumin: 4.1 g/dL (ref 3.6–4.8)
Alkaline Phosphatase: 87 IU/L (ref 39–117)
BUN/Creatinine Ratio: 13 (ref 12–28)
BUN: 13 mg/dL (ref 8–27)
Bilirubin Total: 0.5 mg/dL (ref 0.0–1.2)
CALCIUM: 9.1 mg/dL (ref 8.7–10.3)
CO2: 28 mmol/L (ref 18–29)
CREATININE: 0.99 mg/dL (ref 0.57–1.00)
Chloride: 98 mmol/L (ref 96–106)
GFR, EST AFRICAN AMERICAN: 69 (ref >59)
GFR, EST NON AFRICAN AMERICAN: 60 (ref >59)
Globulin, Total: 2.9 (ref 1.5–4.5)
Glucose: 91 mg/dL (ref 65–99)
POTASSIUM: 4.9 mmol/L (ref 3.5–5.2)
Sodium: 140 mmol/L (ref 134–144)
TOTAL PROTEIN: 7 g/dL (ref 6.0–8.5)

## 2016-11-08 LAB — VITAMIN D 25 HYDROXY (VIT D DEFICIENCY, FRACTURES): Vit D, 25-Hydroxy: 15.8 ng/mL — ABNORMAL LOW (ref 30.0–100.0)

## 2016-11-08 LAB — TSH: TSH: 2.34 u[IU]/mL (ref 0.450–4.500)

## 2016-11-08 LAB — HEMOGLOBIN A1C
Est. average glucose Bld gHb Est-mCnc: 108
Hgb A1c MFr Bld: 5.4 % (ref 4.8–5.6)

## 2016-11-08 LAB — VITAMIN B12: Vitamin B-12: 280 pg/mL (ref 232–1245)

## 2016-11-10 NOTE — Telephone Encounter (Signed)
I have tried to call patient but have been unable to leave a message. Zocor is not on her current medication list.

## 2016-11-11 ENCOUNTER — Other Ambulatory Visit: Payer: Self-pay | Admitting: Adult Health

## 2016-11-11 DIAGNOSIS — E559 Vitamin D deficiency, unspecified: Secondary | ICD-10-CM

## 2016-11-11 DIAGNOSIS — E7849 Other hyperlipidemia: Secondary | ICD-10-CM

## 2016-11-11 MED ORDER — ATORVASTATIN CALCIUM 20 MG PO TABS: 20.0000 mg | ORAL_TABLET | Freq: Every day | ORAL | 1 refills | Status: DC

## 2016-11-11 MED ORDER — VITAMIN D (ERGOCALCIFEROL) 1.25 MG (50000 UNIT) PO CAPS: 50000.0000 [IU] | ORAL_CAPSULE | ORAL | 0 refills | Status: AC

## 2016-11-11 NOTE — Progress Notes (Signed)
Started on Atorvastatin 20mg  daily at HS and Ergocalciferol 50,000 IU weekly. Will re-check LFTs in 4 weeks, re-check vit d level in 4 months.

## 2016-11-11 NOTE — Telephone Encounter (Signed)
Pt was confused with Zocor vs. Zofran.  Tiajuana Amass. Nelson, CMA

## 2016-11-22 ENCOUNTER — Other Ambulatory Visit: Payer: Self-pay | Admitting: Rheumatology

## 2016-11-27 ENCOUNTER — Telehealth: Payer: Self-pay | Admitting: Rheumatology

## 2016-11-27 NOTE — Telephone Encounter (Signed)
Last Visit: 05/14/16  Next Visit was due February 2018. Message sent to the front to schedule patient. UDS: 05/2015 Narc Agreement: 12/2015 Patient will come to update UDS tomorrow.   Okay to refill Tramadol?

## 2016-11-27 NOTE — Telephone Encounter (Signed)
-----   Message from Henriette CombsAndrea L Hatton, LPN sent at 8/65/[id number]/14/2018  9:36 AM EDT ----- Regarding: Please schdule patient follow up visit Please schdule patient follow up visit.Patient was due February 2018. Thanks!

## 2016-11-27 NOTE — Telephone Encounter (Signed)
ok 

## 2016-11-27 NOTE — Telephone Encounter (Signed)
Patient is coming in tomorrow to do her blood work and will schedule her FU appointment then.

## 2016-11-28 ENCOUNTER — Other Ambulatory Visit: Payer: Self-pay | Admitting: Rheumatology

## 2016-11-28 ENCOUNTER — Other Ambulatory Visit: Payer: Self-pay | Admitting: *Deleted

## 2016-11-28 DIAGNOSIS — Z5181 Encounter for therapeutic drug level monitoring: Secondary | ICD-10-CM

## 2016-11-28 DIAGNOSIS — Z79899 Other long term (current) drug therapy: Secondary | ICD-10-CM

## 2016-11-28 LAB — CBC WITH DIFFERENTIAL/PLATELET
BASOS ABS: 0 {cells}/uL (ref 0–200)
BASOS PCT: 0 %
EOS ABS: 160 {cells}/uL (ref 15–500)
EOS PCT: 2 %
HCT: 40.6 % (ref 35.0–45.0)
HEMOGLOBIN: 13.1 g/dL (ref 11.7–15.5)
LYMPHS ABS: 2560 {cells}/uL (ref 850–3900)
Lymphocytes Relative: 32 %
MCH: 28.4 pg (ref 27.0–33.0)
MCHC: 32.3 g/dL (ref 32.0–36.0)
MCV: 87.9 fL (ref 80.0–100.0)
MONOS PCT: 8 %
MPV: 10.3 fL (ref 7.5–12.5)
Monocytes Absolute: 640 cells/uL (ref 200–950)
NEUTROS ABS: 4640 {cells}/uL (ref 1500–7800)
Neutrophils Relative %: 58 %
PLATELETS: 287 10*3/uL (ref 140–400)
RBC: 4.62 MIL/uL (ref 3.80–5.10)
RDW: 14 % (ref 11.0–15.0)
WBC: 8 10*3/uL (ref 3.8–10.8)

## 2016-11-28 NOTE — Telephone Encounter (Signed)
Prescription faxed to the pharmacy on 11/27/16

## 2016-11-28 NOTE — Telephone Encounter (Signed)
Patient is requesting refill of [facility] be [facility]. Patient came in for labs today.

## 2016-11-29 LAB — COMPLETE METABOLIC PANEL WITH GFR
ALBUMIN: 3.9 g/dL (ref 3.6–5.1)
ALK PHOS: 90 U/L (ref 33–130)
ALT: 13 U/L (ref 6–29)
AST: 17 U/L (ref 10–35)
BILIRUBIN TOTAL: 0.9 mg/dL (ref 0.2–1.2)
BUN: 10 mg/dL (ref 7–25)
CO2: 25 mmol/L (ref 20–31)
CREATININE: 0.91 mg/dL (ref 0.50–0.99)
Calcium: 9.4 mg/dL (ref 8.6–10.4)
Chloride: 103 mmol/L (ref 98–110)
GFR, EST NON AFRICAN AMERICAN: 66 mL/min (ref >=60)
GFR, Est African American: 77 mL/min (ref >=60)
GLUCOSE: 77 mg/dL (ref 65–99)
Potassium: 4.3 mmol/L (ref 3.5–5.3)
SODIUM: 141 mmol/L (ref 135–146)
TOTAL PROTEIN: 6.9 g/dL (ref 6.1–8.1)

## 2016-11-29 LAB — PAIN MGMT, PROFILE 5 W/CONF, U
AMPHETAMINES: NEGATIVE ng/mL (ref <500)
BARBITURATES: NEGATIVE ng/mL (ref <300)
BENZODIAZEPINES: NEGATIVE ng/mL (ref <100)
Cocaine Metabolite: NEGATIVE ng/mL (ref <150)
Creatinine: 30.8 mg/dL (ref >=20.0)
MARIJUANA METABOLITE: NEGATIVE ng/mL (ref <20)
METHADONE METABOLITE: NEGATIVE ng/mL (ref <100)
OPIATES: NEGATIVE ng/mL (ref <100)
OXYCODONE: NEGATIVE ng/mL (ref <100)
Oxidant: NEGATIVE ug/mL (ref <200)
pH: 6.51 (ref 4.5–9.0)

## 2016-12-01 NOTE — Progress Notes (Signed)
C/w

## 2016-12-12 DIAGNOSIS — M5134 Other intervertebral disc degeneration, thoracic region: Secondary | ICD-10-CM | POA: Insufficient documentation

## 2016-12-12 DIAGNOSIS — M47816 Spondylosis without myelopathy or radiculopathy, lumbar region: Secondary | ICD-10-CM | POA: Insufficient documentation

## 2016-12-12 DIAGNOSIS — M47812 Spondylosis without myelopathy or radiculopathy, cervical region: Secondary | ICD-10-CM | POA: Insufficient documentation

## 2016-12-12 NOTE — Progress Notes (Signed)
Office Visit Note  Patient: Bridget Hampton             Date of Birth: 02/05/1951           MRN: [id number]             PCP: Laurance Flatten, NP Referring: Malon Kindle, NP Visit Date: 12/20/2016 Occupation: @GUAROCC @    Subjective:  Increased back pain   History of Present Illness: Bridget Hampton is a 66 y.o. female with history of fibromyalgia this disease and osteoarthritis. She states she's been having some lower back pain for last 6 months. It is progressively getting worse. She states the lower back pain radiates into her bilateral lower extremities. She has had cortisone injections to her lumbar region in the past at [facility]. She has difficulty walking, climbing stairs and bending over. She reports nocturnal pain as well. Her neck is a stiff. She denies joint swelling.Her fibromyalgia syndrome is fairly well controlled. She reports the pain from fibromyalgia on a scale of 0-10 about 4 and fatigue about 8.  Activities of Daily Living:  Patient reports morning stiffness for 1 hour.   Patient Reports nocturnal pain.  Difficulty dressing/grooming: Reports Difficulty climbing stairs: Reports Difficulty getting out of chair: Reports Difficulty using hands for taps, buttons, cutlery, and/or writing: Denies   Review of Systems  Constitutional: Positive for fatigue. Negative for night sweats, weight gain, weight loss and weakness.  HENT: Positive for mouth dryness. Negative for mouth sores, trouble swallowing, trouble swallowing and nose dryness.   Eyes: Positive for dryness. Negative for pain, redness and visual disturbance.  Respiratory: Negative for cough and difficulty breathing.   Cardiovascular: Negative for chest pain, palpitations, hypertension, irregular heartbeat and swelling in legs/feet.  Gastrointestinal: Positive for constipation and diarrhea. Negative for blood in stool.       History of IBS  Endocrine: Negative for increased urination.  Genitourinary: Negative for  vaginal dryness.  Musculoskeletal: Positive for arthralgias, joint pain, myalgias, morning stiffness and myalgias. Negative for joint swelling, muscle weakness and muscle tenderness.  Skin: Negative for color change, rash, hair loss, skin tightness, ulcers and sensitivity to sunlight.  Allergic/Immunologic: Negative for susceptible to infections.  Neurological: Negative for dizziness, memory loss and night sweats.  Hematological: Negative for swollen glands.  Psychiatric/Behavioral: Positive for sleep disturbance. Negative for depressed mood. The patient is not nervous/anxious.     PMFS History:  Patient Active Problem List   Diagnosis Date Noted  . Spondylosis of lumbar region without myelopathy or radiculopathy 12/12/2016  . DJD (degenerative joint disease), cervical 12/12/2016  . DDD (degenerative disc disease), thoracic 12/12/2016  . Nausea & vomiting 11/04/2016  . Dizzinesses 11/04/2016  . Back pain 11/04/2016  . Sciatica 11/04/2016  . BMI 36.0-36.9,adult 11/04/2016  . Vitamin D deficiency   . Hyperlipidemia   . Fibromyalgia     Past Medical History:  Diagnosis Date  . Fibromyalgia   . Hyperlipidemia   . Vitamin D deficiency     Family History  Problem Relation Age of Onset  . Congestive Heart Failure Mother   . Valvular heart disease Mother   . Alcohol abuse Father   . Cancer Sister     Breast and Colon  . Cancer Maternal Uncle     lung  . Heart attack Maternal Grandmother   . Stroke Maternal Grandfather    Past Surgical History:  Procedure Laterality Date  . ABDOMINAL HYSTERECTOMY    . SPINE SURGERY  [id number]   Social History   Social History Narrative  . No narrative on file     Objective: Vital Signs: BP 128/78   Pulse 78   Resp 16   Wt 218 lb (98.9 kg)   BMI 37.71 kg/m    Physical Exam  Constitutional: She is oriented to person, place, and time. She appears well-developed and well-nourished.  HENT:  Head: Normocephalic and atraumatic.  Eyes:  Conjunctivae and EOM are normal.  Neck: Normal range of motion.  Cardiovascular: Normal rate, regular rhythm, normal heart sounds and intact distal pulses.   Pulmonary/Chest: Effort normal and breath sounds normal.  Abdominal: Soft. Bowel sounds are normal.  Lymphadenopathy:    She has no cervical adenopathy.  Neurological: She is alert and oriented to person, place, and time.  Skin: Skin is warm and dry. Capillary refill takes less than 2 seconds.  Psychiatric: She has a normal mood and affect. Her behavior is normal.  Nursing note and vitals reviewed.    Musculoskeletal Exam: C-spine limited range of motion, thoracic and lumbar spine discomfort with range of motion which is limited. She is some tenderness over bilateral SI joints area. Shoulder joints, elbow joints, wrist joints with good range of motion. She is thickening of bilateral DIP joints in her hands consistent with osteoarthritis. Hip joints knee joints ankles MTPs PIPs DIPs with good range of motion with no synovitis.  CDAI Exam: No CDAI exam completed.    Investigation: No additional findings.   Imaging: Xr Thoracic Spine 2 View  Result Date: 12/20/2016 She has mild levoscoliosis with multilevel spondylosis  Xr Lumbar Spine 2-3 Views  Result Date: 12/20/2016 She has severe dextro scoliosis with multilevel spondylosis and disc space narrowing was noted between L1-2, L2-3, L3-4, L4-5, L5-S1 facet joint arthropathy was also noted. Impression: Lumbar dextro scoliosis severe and the level spondylosis   Speciality Comments: No specialty comments available.    Procedures:  No procedures performed Allergies: Patient has no known allergies.   Assessment / Plan:     Visit Diagnoses: Fibromyalgia: She does have generalized pain from fibromyalgia.  DJD (degenerative joint disease), cervical - Status post fusion: Her C-spine pain is tolerable.  DDD (degenerative disc disease), thoracic: She's been having increased  thoracic pain - Plan: XR Thoracic Spine 2 View. The x-ray revealed mild scoliosis and multilevel spondylosis.  Spondylosis of lumbar region without myelopathy or radiculopathy - status post laser surgery 2014 in [place]: She has increased lower back pain and SI joint pain. She complains of pain radiating into her bilateral lower extremities especially to the left lower extremity. - Plan: XR Lumbar Spine 2-3 Views. The x-ray reveals severe dextro scoliosis and multilevel spondylosis. The she's having radiculopathy we'll schedule MRI of her lumbar spine.  Trochanteric bursitis of both hips: She continues to have some discomfort  Vitamin D deficiency  Family history of allergies  History of migraine  History of gastroesophageal reflux (GERD)  Dyslipidemia    Orders: Orders Placed This Encounter  Procedures  . XR Thoracic Spine 2 View  . XR Lumbar Spine 2-3 Views   No orders of the defined types were placed in this encounter.   Face-to-face time spent with patient was 30 minutes. 50% of time was spent in counseling and coordination of care.  Follow-Up Instructions: Return in about 6 months (around 06/21/2017) for DDD, FMS.   Pollyann SavoyShaili Shaquille Murdy, MD  Note - This record has been created using Animal nutritionistDragon software.  Chart creation errors have been sought,  but may not always  have been located. Such creation errors do not reflect on  the standard of medical care.

## 2016-12-20 ENCOUNTER — Encounter: Payer: Self-pay | Admitting: Rheumatology

## 2016-12-20 ENCOUNTER — Ambulatory Visit (INDEPENDENT_AMBULATORY_CARE_PROVIDER_SITE_OTHER): Payer: 59

## 2016-12-20 ENCOUNTER — Ambulatory Visit (INDEPENDENT_AMBULATORY_CARE_PROVIDER_SITE_OTHER): Payer: 59 | Admitting: Rheumatology

## 2016-12-20 ENCOUNTER — Other Ambulatory Visit: Payer: Self-pay | Admitting: *Deleted

## 2016-12-20 VITALS — BP 128/78 | HR 78 | Resp 16 | Wt 218.0 lb

## 2016-12-20 DIAGNOSIS — M47816 Spondylosis without myelopathy or radiculopathy, lumbar region: Secondary | ICD-10-CM

## 2016-12-20 DIAGNOSIS — M797 Fibromyalgia: Secondary | ICD-10-CM

## 2016-12-20 DIAGNOSIS — M5134 Other intervertebral disc degeneration, thoracic region: Secondary | ICD-10-CM

## 2016-12-20 DIAGNOSIS — Z8719 Personal history of other diseases of the digestive system: Secondary | ICD-10-CM

## 2016-12-20 DIAGNOSIS — E559 Vitamin D deficiency, unspecified: Secondary | ICD-10-CM | POA: Diagnosis not present

## 2016-12-20 DIAGNOSIS — M7061 Trochanteric bursitis, right hip: Secondary | ICD-10-CM

## 2016-12-20 DIAGNOSIS — Z8669 Personal history of other diseases of the nervous system and sense organs: Secondary | ICD-10-CM

## 2016-12-20 DIAGNOSIS — M47812 Spondylosis without myelopathy or radiculopathy, cervical region: Secondary | ICD-10-CM

## 2016-12-20 DIAGNOSIS — E785 Hyperlipidemia, unspecified: Secondary | ICD-10-CM | POA: Diagnosis not present

## 2016-12-20 DIAGNOSIS — M5442 Lumbago with sciatica, left side: Principal | ICD-10-CM

## 2016-12-20 DIAGNOSIS — G8929 Other chronic pain: Secondary | ICD-10-CM

## 2016-12-20 DIAGNOSIS — M5030 Other cervical disc degeneration, unspecified cervical region: Secondary | ICD-10-CM

## 2016-12-20 DIAGNOSIS — M7062 Trochanteric bursitis, left hip: Secondary | ICD-10-CM

## 2016-12-20 DIAGNOSIS — Z8489 Family history of other specified conditions: Secondary | ICD-10-CM

## 2016-12-27 ENCOUNTER — Telehealth (INDEPENDENT_AMBULATORY_CARE_PROVIDER_SITE_OTHER): Payer: Self-pay | Admitting: *Deleted

## 2016-12-27 MED ORDER — DIAZEPAM 5 MG PO TABS: 5.0000 mg | ORAL_TABLET | Freq: Once | ORAL | 0 refills | Status: DC | PRN

## 2016-12-27 NOTE — Telephone Encounter (Signed)
You wanted to review this for the Valium  Ok to send in for MRI scan ?

## 2016-12-27 NOTE — Telephone Encounter (Signed)
Valium, 5 mg prior to MRI study; dispense one with no refill

## 2016-12-27 NOTE — Telephone Encounter (Signed)
Patient called in this morning in regards to needing a prescription for Valium for her MRI because she has panic attacks. Her CB# [phone]. Thank you

## 2016-12-27 NOTE — Telephone Encounter (Signed)
This prescription needs to be printed and signed. Will you please print and sign it. Thanks!

## 2016-12-30 ENCOUNTER — Ambulatory Visit (HOSPITAL_COMMUNITY)
Admission: RE | Admit: 2016-12-30 | Discharge: 2016-12-30 | Disposition: A | Discharge status: Home / Self Care | Payer: 59 | Source: Ambulatory Visit | Attending: Rheumatology | Admitting: Rheumatology

## 2016-12-30 ENCOUNTER — Encounter (HOSPITAL_COMMUNITY): Payer: Self-pay

## 2016-12-30 DIAGNOSIS — M5442 Lumbago with sciatica, left side: Principal | ICD-10-CM

## 2016-12-30 DIAGNOSIS — G8929 Other chronic pain: Secondary | ICD-10-CM

## 2017-01-03 ENCOUNTER — Telehealth: Payer: Self-pay | Admitting: Rheumatology

## 2017-01-03 NOTE — Telephone Encounter (Signed)
Order faxed to number given above  

## 2017-01-03 NOTE — Telephone Encounter (Signed)
Patient called stating that Dr. Corliss Skains ordered an MRI for her, she was unable to complete the MRI because of being claustrophobic.  Patient is wanting an open Mri and is wanting [place] to send the order to [facility] in [facility].  Their number is [phone].  Patient's number is [phone].  Thank you.

## 2017-01-03 NOTE — Telephone Encounter (Signed)
Marcelino Duster from [facility] left a message and asked if we could fax the order from her MRI to them at fax#[phone].  Thank you.

## 2017-01-03 NOTE — Telephone Encounter (Signed)
Can you help with this?

## 2017-01-14 ENCOUNTER — Telehealth: Payer: Self-pay | Admitting: Radiology

## 2017-01-14 NOTE — Telephone Encounter (Signed)
Called patient regarding her MRI report She has worsening of her lumbar spine condition protrusion of disk L1 /2 and L5 S1  I have faxed report of the MRI to Dr Yevette Edwards and asked patient to call and make her own appointment, provided her the number She states you did offer her another medication since the Tramadol does not offer much relief. She would like you to give something else if you would.

## 2017-01-14 NOTE — Telephone Encounter (Signed)
I would not be able to prescribe anything else. We can refer her to pain clinic.

## 2017-01-15 NOTE — Telephone Encounter (Signed)
Patient advised Dr. Corliss Skains would not be able to prescribe anything else and we could refer her to a pain clinic. Patient states she has an appointment with Dr. Eunice Blase next Monday evening and will see what he wants to do.

## 2017-01-29 ENCOUNTER — Other Ambulatory Visit: Payer: Self-pay

## 2017-01-29 ENCOUNTER — Other Ambulatory Visit: Payer: Self-pay | Admitting: Rheumatology

## 2017-01-29 DIAGNOSIS — Z1231 Encounter for screening mammogram for malignant neoplasm of breast: Secondary | ICD-10-CM

## 2017-02-13 ENCOUNTER — Other Ambulatory Visit: Payer: Self-pay | Admitting: Family Medicine

## 2017-02-13 DIAGNOSIS — Z1231 Encounter for screening mammogram for malignant neoplasm of breast: Secondary | ICD-10-CM

## 2017-02-18 ENCOUNTER — Ambulatory Visit
Admission: RE | Admit: 2017-02-18 | Discharge: 2017-02-18 | Disposition: A | Discharge status: Home / Self Care | Payer: 59 | Source: Ambulatory Visit | Attending: Family Medicine | Admitting: Family Medicine

## 2017-02-18 DIAGNOSIS — Z1231 Encounter for screening mammogram for malignant neoplasm of breast: Secondary | ICD-10-CM

## 2017-04-28 ENCOUNTER — Other Ambulatory Visit: Payer: Self-pay | Admitting: Adult Health

## 2017-06-13 NOTE — Progress Notes (Deleted)
Office Visit Note  Patient: Bridget Hampton             Date of Birth: 10-23-1950           MRN: [id number]             PCP: Patient, No Pcp Per Referring: Julaine Fusi, NP Visit Date: 06/20/2017 Occupation: @    Subjective:  No chief complaint on file.   History of Present Illness: Bridget Hampton is a 66 y.o. female ***   Activities of Daily Living:  Patient reports morning stiffness for *** {minute/hour:19697}.   Patient {ACTIONS;DENIES/REPORTS:21021675::"Denies"} nocturnal pain.  Difficulty dressing/grooming: {ACTIONS;DENIES/REPORTS:21021675::"Denies"} Difficulty climbing stairs: {ACTIONS;DENIES/REPORTS:21021675::"Denies"} Difficulty getting out of chair: {ACTIONS;DENIES/REPORTS:21021675::"Denies"} Difficulty using hands for taps, buttons, cutlery, and/or writing: {ACTIONS;DENIES/REPORTS:21021675::"Denies"}   No Rheumatology ROS completed.   PMFS History:  Patient Active Problem List   Diagnosis Date Noted  . Dyslipidemia 06/19/2017  . History of migraine 06/19/2017  . Spondylosis of lumbar region without myelopathy or radiculopathy 12/12/2016  . DJD (degenerative joint disease), cervical 12/12/2016  . DDD (degenerative disc disease), thoracic 12/12/2016  . Nausea & vomiting 11/04/2016  . Dizzinesses 11/04/2016  . Back pain 11/04/2016  . Sciatica 11/04/2016  . BMI 36.0-36.9,adult 11/04/2016  . Vitamin D deficiency   . Hyperlipidemia   . Fibromyalgia     Past Medical History:  Diagnosis Date  . Fibromyalgia   . Hyperlipidemia   . Vitamin D deficiency     Family History  Problem Relation Age of Onset  . Congestive Heart Failure Mother   . Valvular heart disease Mother   . Alcohol abuse Father   . Cancer Sister        Breast and Colon  . Breast cancer Sister   . Cancer Maternal Uncle        lung  . Heart attack Maternal Grandmother   . Stroke Maternal Grandfather    Past Surgical History:  Procedure Laterality Date  . ABDOMINAL HYSTERECTOMY     . SPINE SURGERY     2015   Social History   Social History Narrative  . No narrative on file     Objective: Vital Signs: There were no vitals taken for this visit.   Physical Exam   Musculoskeletal Exam: ***  CDAI Exam: No CDAI exam completed.    Investigation: No additional findings. CBC Latest Ref Rng & Units 11/28/2016 11/07/2016  WBC 3.8 - 10.8 K/uL 8.0 5.1  Hemoglobin 11.7 - 15.5 g/dL 40.9 81.1  Hematocrit 91.4 - 45.0 % 40.6 40.2  Platelets 140 - 400 K/uL 287 234   CMP Latest Ref Rng & Units 11/28/2016 11/07/2016  Glucose 65 - 99 mg/dL 77 91  BUN 7 - 25 mg/dL 10 13  Creatinine 7.82 - 0.99 mg/dL 9.56 2.13  Sodium 086 - 146 mmol/L 141 140  Potassium 3.5 - 5.3 mmol/L 4.3 4.9  Chloride 98 - 110 mmol/L 103 98  CO2 20 - 31 mmol/L 25 28  Calcium 8.6 - 10.4 mg/dL 9.4 9.1  Total Protein 6.1 - 8.1 g/dL 6.9 7.0  Total Bilirubin 0.2 - 1.2 mg/dL 0.9 0.5  Alkaline Phos 33 - 130 U/L 90 87  AST 10 - 35 U/L 17 14  ALT 6 - 29 U/L 13 13  UDS 11/28/2016  Imaging: No results found.  Speciality Comments: No specialty comments available.    Procedures:  No procedures performed Allergies: Patient has no known allergies.   Assessment / Plan:     Visit  Diagnoses: Fibromyalgia  Other fatigue  Primary insomnia  Primary osteoarthritis of both knees  DJD (degenerative joint disease), cervical  DDD (degenerative disc disease), thoracic  DDD (degenerative disc disease), lumbar  Other chronic pain - Tramadol 50 mg 1-2 tablets by mouth twice a day when necessary  Vitamin D deficiency  History of hypertension  Dyslipidemia  History of migraine    Orders: No orders of the defined types were placed in this encounter.  No orders of the defined types were placed in this encounter.   Face-to-face time spent with patient was *** minutes. 50% of time was spent in counseling and coordination of care.  Follow-Up Instructions: No Follow-up on file.   Pollyann Savoy, MD  Note - This record has been created using Animal nutritionist.  Chart creation errors have been sought, but may not always  have been located. Such creation errors do not reflect on  the standard of medical care.

## 2017-06-19 DIAGNOSIS — E785 Hyperlipidemia, unspecified: Secondary | ICD-10-CM | POA: Insufficient documentation

## 2017-06-19 DIAGNOSIS — Z8669 Personal history of other diseases of the nervous system and sense organs: Secondary | ICD-10-CM | POA: Insufficient documentation

## 2017-06-20 ENCOUNTER — Ambulatory Visit: Payer: 59 | Admitting: Rheumatology

## 2018-04-21 ENCOUNTER — Other Ambulatory Visit: Payer: Self-pay | Admitting: Family Medicine

## 2018-04-21 DIAGNOSIS — R5381 Other malaise: Secondary | ICD-10-CM

## 2018-04-24 ENCOUNTER — Other Ambulatory Visit: Payer: Self-pay | Admitting: Family Medicine

## 2018-04-24 DIAGNOSIS — E559 Vitamin D deficiency, unspecified: Secondary | ICD-10-CM

## 2018-04-30 ENCOUNTER — Other Ambulatory Visit: Payer: Self-pay | Admitting: Family Medicine

## 2018-04-30 ENCOUNTER — Ambulatory Visit
Admission: RE | Admit: 2018-04-30 | Discharge: 2018-04-30 | Disposition: A | Discharge status: Home / Self Care | Payer: 59 | Source: Ambulatory Visit | Attending: Family Medicine | Admitting: Family Medicine

## 2018-04-30 DIAGNOSIS — M545 Low back pain, unspecified: Secondary | ICD-10-CM

## 2020-01-13 ENCOUNTER — Other Ambulatory Visit: Payer: Self-pay | Admitting: Family Medicine

## 2020-01-13 ENCOUNTER — Ambulatory Visit
Admission: RE | Admit: 2020-01-13 | Discharge: 2020-01-13 | Disposition: A | Discharge status: Home / Self Care | Payer: 59 | Source: Ambulatory Visit | Attending: Family Medicine | Admitting: Family Medicine

## 2020-01-13 DIAGNOSIS — R600 Localized edema: Secondary | ICD-10-CM

## 2020-01-13 DIAGNOSIS — M25562 Pain in left knee: Secondary | ICD-10-CM

## 2020-01-13 DIAGNOSIS — M25561 Pain in right knee: Secondary | ICD-10-CM

## 2020-01-17 NOTE — Progress Notes (Signed)
Office Visit Note  Patient: Bridget Hampton             Date of Birth: [birth year]           MRN: [id number]             PCP: Patient, No Pcp Per Referring: No ref. provider found Visit Date: 01/24/2020 Occupation: @GUAROCC @  Subjective:  Fibromyalgia (Not doing good, constant total body pain )   History of Present Illness: Bridget Hampton is a 70 y.o. female with history of fibromyalgia and degenerative disc disease.  She is a scheduled to have lumbar discectomy on Jan 28, 2020.  She is having flares of fibromyalgia with generalized pain and discomfort.  She complains of cervical, thoracic region and pain in her arms.  He is having insomnia due to nocturnal pain.  She has been getting hydrocodone from her PCP for pain management.  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  She has to lift heavy objects and it is very hard on her back.  She also has difficulty walking right now and want to have a handicap placard filled.  Activities of Daily Living:  Patient reports morning stiffness for 15 minutes.   Patient Reports nocturnal pain.  Difficulty dressing/grooming: Reports Difficulty climbing stairs: Reports Difficulty getting out of chair: Reports Difficulty using hands for taps, buttons, cutlery, and/or writing: Reports  Review of Systems  Constitutional: Positive for fatigue. Negative for night sweats, weight gain and weight loss.  HENT: Negative for mouth sores, trouble swallowing, trouble swallowing, mouth dryness and nose dryness.   Eyes: Negative for pain, redness, visual disturbance and dryness.  Respiratory: Positive for shortness of breath. Negative for cough and difficulty breathing.        Related to weight gain.  Cardiovascular: Positive for swelling in legs/feet. Negative for chest pain, palpitations, hypertension and irregular heartbeat.  Gastrointestinal: Positive for constipation and diarrhea. Negative for blood in stool.  Endocrine: Negative for cold intolerance,  heat intolerance and increased urination.  Genitourinary: Negative for difficulty urinating and vaginal dryness.  Musculoskeletal: Positive for arthralgias, joint pain, morning stiffness and muscle tenderness. Negative for gait problem, joint swelling, myalgias, muscle weakness and myalgias.  Skin: Negative for color change, rash, hair loss, skin tightness, ulcers and sensitivity to sunlight.  Allergic/Immunologic: Negative for susceptible to infections.  Neurological: Negative for dizziness, numbness, memory loss and night sweats.  Hematological: Negative for bruising/bleeding tendency and swollen glands.  Psychiatric/Behavioral: Positive for sleep disturbance. Negative for depressed mood. The patient is not nervous/anxious.     PMFS History:  Patient Active Problem List   Diagnosis Date Noted  . Dyslipidemia 06/19/2017  . History of migraine 06/19/2017  . Spondylosis of lumbar region without myelopathy or radiculopathy 12/12/2016  . DJD (degenerative joint disease), cervical 12/12/2016  . DDD (degenerative disc disease), thoracic 12/12/2016  . Nausea & vomiting 11/04/2016  . Dizzinesses 11/04/2016  . Back pain 11/04/2016  . Sciatica 11/04/2016  . BMI 36.0-36.9,adult 11/04/2016  . Vitamin D deficiency   . Hyperlipidemia   . Fibromyalgia     Past Medical History:  Diagnosis Date  . DDD (degenerative disc disease), cervical   . DDD (degenerative disc disease), thoracic   . Fibromyalgia   . Fibromyalgia   . Hyperlipidemia   . Obesity   . Vitamin D deficiency     Family History  Problem Relation Age of Onset  . Congestive Heart Failure Mother   . Valvular heart disease Mother   .  Alcohol abuse Father   . Cancer Sister        Breast and Colon  . Breast cancer Sister   . Cancer Maternal Uncle        lung  . Heart attack Maternal Grandmother   . Stroke Maternal Grandfather    Past Surgical History:  Procedure Laterality Date  . ABDOMINAL HYSTERECTOMY    . SPINE SURGERY      2015   Social History   Social History Narrative  . Not on file    There is no immunization history on file for this patient.   Objective: Vital Signs: BP (!) 144/75 (BP Location: Left Arm, Patient Position: Sitting, Cuff Size: Normal)   Pulse 66   Resp 16   Ht 5\' 4"  (1.626 m)   Wt 220 lb 3.2 oz (99.9 kg)   BMI 37.80 kg/m    Physical Exam Vitals and nursing note reviewed.  Constitutional:      Appearance: She is well-developed.  HENT:     Head: Normocephalic and atraumatic.  Eyes:     Conjunctiva/sclera: Conjunctivae normal.  Cardiovascular:     Rate and Rhythm: Normal rate and regular rhythm.     Heart sounds: Normal heart sounds.  Pulmonary:     Effort: Pulmonary effort is normal.     Breath sounds: Normal breath sounds.  Abdominal:     General: Bowel sounds are normal.     Palpations: Abdomen is soft.  Musculoskeletal:     Cervical back: Normal range of motion.  Lymphadenopathy:     Cervical: No cervical adenopathy.  Skin:    General: Skin is warm and dry.     Capillary Refill: Capillary refill takes less than 2 seconds.  Neurological:     Mental Status: She is alert and oriented to person, place, and time.  Psychiatric:        Behavior: Behavior normal.      Musculoskeletal Exam: Patient has limited range of motion of her cervical and lumbar spine.  Shoulder joints, elbow joints with good range of motion.  She has bilateral DIP thickening consistent with osteoarthritis.  She also had discomfort range of motion of her knee joints but no warmth swelling or effusion was noted.  CDAI Exam: CDAI Score: -- Patient Global: --; Provider Global: -- Swollen: --; Tender: -- Joint Exam 01/24/2020   No joint exam has been documented for this visit   There is currently no information documented on the homunculus. Go to the Rheumatology activity and complete the homunculus joint exam.  Investigation: No additional findings.  Imaging: DG Knee Complete 4 Views  Left  Result Date: 01/13/2020 CLINICAL DATA:  Bilateral knee pain EXAM: LEFT KNEE - COMPLETE 4+ VIEW COMPARISON:  None. FINDINGS: No fracture or malalignment. Mild medial joint space degenerative change. No large knee effusion IMPRESSION: Minimal degenerative change. No acute osseous abnormality. Electronically Signed   By: Donavan Foil M.D.   On: 01/13/2020 20:21   DG Knee Complete 4 Views Right  Result Date: 01/13/2020 CLINICAL DATA:  Pain in both knees EXAM: RIGHT KNEE - COMPLETE 4+ VIEW COMPARISON:  None. FINDINGS: No fracture or malalignment. Mild medial joint space degenerative change. No significant effusion IMPRESSION: Mild degenerative changes. Electronically Signed   By: Donavan Foil M.D.   On: 01/13/2020 20:22    Recent Labs: Lab Results  Component Value Date   WBC 8.0 11/28/2016   HGB 13.1 11/28/2016   PLT 287 11/28/2016   NA 141 11/28/2016  K 4.3 11/28/2016   CL 103 11/28/2016   CO2 25 11/28/2016   GLUCOSE 77 11/28/2016   BUN 10 11/28/2016   CREATININE 0.91 11/28/2016   BILITOT 0.9 11/28/2016   ALKPHOS 90 11/28/2016   AST 17 11/28/2016   ALT 13 11/28/2016   PROT 6.9 11/28/2016   ALBUMIN 3.9 11/28/2016   CALCIUM 9.4 11/28/2016   GFRAA 77 11/28/2016    Speciality Comments: No specialty comments available.  Procedures:  No procedures performed Allergies: Patient has no known allergies.   Assessment / Plan:     Visit Diagnoses: Primary osteoarthritis of both hands-she has DIP thickening.  Joint protection was discussed.  Chronic pain of both knees-she has been having increased pain in her knee joints.  She states she had x-rays done by her PCP.  I do not have the results available.  Patient will bring it at the next visit.  DDD (degenerative disc disease), cervical-she has chronic discomfort.  DDD (degenerative disc disease), thoracic  DDD lumbar-she has been having increased pain and discomfort.  She is scheduled for discectomy on May 14.  Per her request  parking placard was given.  Fibromyalgia-she is having a flare with generalized pain and discomfort.  Trochanteric bursitis of both hips-she continues to have trochanteric bursitis pain.  IT band stretches were discussed.  Vitamin D deficiency-she is on supplement.  Hx of migraines  History of gastroesophageal reflux (GERD)  Dyslipidemia  Orders: No orders of the defined types were placed in this encounter.  No orders of the defined types were placed in this encounter.    Follow-Up Instructions: Return in about 6 months (around 07/26/2020) for OA, FMA,DDD.   Pollyann Savoy, MD  Note - This record has been created using Animal nutritionist.  Chart creation errors have been sought, but may not always  have been located. Such creation errors do not reflect on  the standard of medical care.

## 2020-01-24 ENCOUNTER — Ambulatory Visit (INDEPENDENT_AMBULATORY_CARE_PROVIDER_SITE_OTHER): Payer: 59 | Admitting: Rheumatology

## 2020-01-24 ENCOUNTER — Other Ambulatory Visit: Payer: Self-pay

## 2020-01-24 ENCOUNTER — Encounter: Payer: Self-pay | Admitting: Rheumatology

## 2020-01-24 VITALS — BP 144/75 | HR 66 | Resp 16 | Ht 64.0 in | Wt 220.2 lb

## 2020-01-24 DIAGNOSIS — M25562 Pain in left knee: Secondary | ICD-10-CM

## 2020-01-24 DIAGNOSIS — M7062 Trochanteric bursitis, left hip: Secondary | ICD-10-CM

## 2020-01-24 DIAGNOSIS — M5136 Other intervertebral disc degeneration, lumbar region: Secondary | ICD-10-CM

## 2020-01-24 DIAGNOSIS — M5134 Other intervertebral disc degeneration, thoracic region: Secondary | ICD-10-CM | POA: Diagnosis not present

## 2020-01-24 DIAGNOSIS — M7061 Trochanteric bursitis, right hip: Secondary | ICD-10-CM

## 2020-01-24 DIAGNOSIS — M25561 Pain in right knee: Secondary | ICD-10-CM

## 2020-01-24 DIAGNOSIS — M5030 Other cervical disc degeneration, unspecified cervical region: Secondary | ICD-10-CM

## 2020-01-24 DIAGNOSIS — G8929 Other chronic pain: Secondary | ICD-10-CM

## 2020-01-24 DIAGNOSIS — M47816 Spondylosis without myelopathy or radiculopathy, lumbar region: Secondary | ICD-10-CM

## 2020-01-24 DIAGNOSIS — E559 Vitamin D deficiency, unspecified: Secondary | ICD-10-CM

## 2020-01-24 DIAGNOSIS — E785 Hyperlipidemia, unspecified: Secondary | ICD-10-CM

## 2020-01-24 DIAGNOSIS — M797 Fibromyalgia: Secondary | ICD-10-CM

## 2020-01-24 DIAGNOSIS — Z8669 Personal history of other diseases of the nervous system and sense organs: Secondary | ICD-10-CM

## 2020-01-24 DIAGNOSIS — M19041 Primary osteoarthritis, right hand: Secondary | ICD-10-CM

## 2020-01-24 DIAGNOSIS — M19042 Primary osteoarthritis, left hand: Secondary | ICD-10-CM

## 2020-01-24 DIAGNOSIS — Z8719 Personal history of other diseases of the digestive system: Secondary | ICD-10-CM

## 2020-01-28 HISTORY — PX: BACK SURGERY: SHX140

## 2020-07-14 NOTE — Progress Notes (Deleted)
Office Visit Note  Patient: Bridget Hampton             Date of Birth: [birth year]           MRN: [id number]             PCP: Patient, No Pcp Per Referring: No ref. provider found Visit Date: 07/28/2020 Occupation: @GUAROCC @  Subjective:  No chief complaint on file.   History of Present Illness: Bridget Hampton is a 69 y.o. female ***   Activities of Daily Living:  Patient reports morning stiffness for *** {minute/hour:19697}.   Patient {ACTIONS;DENIES/REPORTS:21021675::"Denies"} nocturnal pain.  Difficulty dressing/grooming: {ACTIONS;DENIES/REPORTS:21021675::"Denies"} Difficulty climbing stairs: {ACTIONS;DENIES/REPORTS:21021675::"Denies"} Difficulty getting out of chair: {ACTIONS;DENIES/REPORTS:21021675::"Denies"} Difficulty using hands for taps, buttons, cutlery, and/or writing: {ACTIONS;DENIES/REPORTS:21021675::"Denies"}  No Rheumatology ROS completed.   PMFS History:  Patient Active Problem List   Diagnosis Date Noted  . Dyslipidemia 06/19/2017  . History of migraine 06/19/2017  . Spondylosis of lumbar region without myelopathy or radiculopathy 12/12/2016  . DJD (degenerative joint disease), cervical 12/12/2016  . DDD (degenerative disc disease), thoracic 12/12/2016  . Nausea & vomiting 11/04/2016  . Dizzinesses 11/04/2016  . Back pain 11/04/2016  . Sciatica 11/04/2016  . BMI 36.0-36.9,adult 11/04/2016  . Vitamin D deficiency   . Hyperlipidemia   . Fibromyalgia     Past Medical History:  Diagnosis Date  . DDD (degenerative disc disease), cervical   . DDD (degenerative disc disease), thoracic   . Fibromyalgia   . Fibromyalgia   . Hyperlipidemia   . Obesity   . Vitamin D deficiency     Family History  Problem Relation Age of Onset  . Congestive Heart Failure Mother   . Valvular heart disease Mother   . Alcohol abuse Father   . Cancer Sister        Breast and Colon  . Breast cancer Sister   . Cancer Maternal Uncle        lung  . Heart attack Maternal  Grandmother   . Stroke Maternal Grandfather    Past Surgical History:  Procedure Laterality Date  . ABDOMINAL HYSTERECTOMY    . SPINE SURGERY     2015   Social History   Social History Narrative  . Not on file    There is no immunization history on file for this patient.   Objective: Vital Signs: There were no vitals taken for this visit.   Physical Exam   Musculoskeletal Exam: ***  CDAI Exam: CDAI Score: -- Patient Global: --; Provider Global: -- Swollen: --; Tender: -- Joint Exam 07/28/2020   No joint exam has been documented for this visit   There is currently no information documented on the homunculus. Go to the Rheumatology activity and complete the homunculus joint exam.  Investigation: No additional findings.  Imaging: No results found.  Recent Labs: Lab Results  Component Value Date   WBC 8.0 11/28/2016   HGB 13.1 11/28/2016   PLT 287 11/28/2016   NA 141 11/28/2016   K 4.3 11/28/2016   CL 103 11/28/2016   CO2 25 11/28/2016   GLUCOSE 77 11/28/2016   BUN 10 11/28/2016   CREATININE 0.91 11/28/2016   BILITOT 0.9 11/28/2016   ALKPHOS 90 11/28/2016   AST 17 11/28/2016   ALT 13 11/28/2016   PROT 6.9 11/28/2016   ALBUMIN 3.9 11/28/2016   CALCIUM 9.4 11/28/2016   GFRAA 77 11/28/2016    Speciality Comments: No specialty comments available.  Procedures:  No procedures performed Allergies: Patient has  no known allergies.   Assessment / Plan:     Visit Diagnoses: No diagnosis found.  Orders: No orders of the defined types were placed in this encounter.  No orders of the defined types were placed in this encounter.   Face-to-face time spent with patient was *** minutes. Greater than 50% of time was spent in counseling and coordination of care.  Follow-Up Instructions: No follow-ups on file.   Ellen Henri, CMA  Note - This record has been created using Animal nutritionist.  Chart creation errors have been sought, but may not always   have been located. Such creation errors do not reflect on  the standard of medical care.

## 2020-07-28 ENCOUNTER — Ambulatory Visit: Payer: 59 | Admitting: Rheumatology

## 2020-07-28 DIAGNOSIS — M7061 Trochanteric bursitis, right hip: Secondary | ICD-10-CM

## 2020-07-28 DIAGNOSIS — M19041 Primary osteoarthritis, right hand: Secondary | ICD-10-CM

## 2020-07-28 DIAGNOSIS — M5134 Other intervertebral disc degeneration, thoracic region: Secondary | ICD-10-CM

## 2020-07-28 DIAGNOSIS — M797 Fibromyalgia: Secondary | ICD-10-CM

## 2020-07-28 DIAGNOSIS — Z8719 Personal history of other diseases of the digestive system: Secondary | ICD-10-CM

## 2020-07-28 DIAGNOSIS — M5030 Other cervical disc degeneration, unspecified cervical region: Secondary | ICD-10-CM

## 2020-07-28 DIAGNOSIS — G8929 Other chronic pain: Secondary | ICD-10-CM

## 2020-07-28 DIAGNOSIS — M5136 Other intervertebral disc degeneration, lumbar region: Secondary | ICD-10-CM

## 2020-07-28 DIAGNOSIS — E785 Hyperlipidemia, unspecified: Secondary | ICD-10-CM

## 2020-07-28 DIAGNOSIS — Z8669 Personal history of other diseases of the nervous system and sense organs: Secondary | ICD-10-CM

## 2020-07-28 DIAGNOSIS — E559 Vitamin D deficiency, unspecified: Secondary | ICD-10-CM

## 2020-08-21 NOTE — Progress Notes (Signed)
Office Visit Note  Patient: Bridget Hampton             Date of Birth: [birth year]           MRN: [id number]             PCP: Patient, No Pcp Per Referring: No ref. provider found Visit Date: 09/01/2020 Occupation: @GUAROCC @  Subjective:  Generalized pain.   History of Present Illness: Monic Engelmann is a 69 y.o. female with history of fibromyalgia, osteoarthritis and degenerative disc disease.  She states she had recent lumbar spine surgery in 78.  She continues to have discomfort in her lower back.  She is also having a flare of fibromyalgia with generalized pain and discomfort.  She also saw an orthopedic surgeon who did x-rays of the hips and knees and found that she has arthritis .  Activities of Daily Living:  Patient reports morning stiffness for 24 hours.   Patient Reports nocturnal pain.  Difficulty dressing/grooming: Reports Difficulty climbing stairs: Reports Difficulty getting out of chair: Reports Difficulty using hands for taps, buttons, cutlery, and/or writing: Reports  Review of Systems  Constitutional: Positive for fatigue.  HENT: Positive for mouth dryness and nose dryness. Negative for mouth sores.   Eyes: Positive for itching. Negative for pain, visual disturbance and dryness.  Respiratory: Negative for cough, hemoptysis, shortness of breath and difficulty breathing.   Cardiovascular: Negative for chest pain, palpitations and swelling in legs/feet.  Gastrointestinal: Positive for abdominal pain, constipation and diarrhea. Negative for blood in stool.  Endocrine: Negative for increased urination.  Genitourinary: Negative for painful urination.  Musculoskeletal: Positive for arthralgias, joint pain, myalgias, morning stiffness, muscle tenderness and myalgias. Negative for joint swelling and muscle weakness.  Skin: Negative for color change, rash and redness.  Allergic/Immunologic: Negative for susceptible to infections.  Neurological: Positive for  numbness and weakness. Negative for dizziness, headaches and memory loss.  Hematological: Negative for swollen glands.  Psychiatric/Behavioral: Positive for confusion and sleep disturbance.    PMFS History:  Patient Active Problem List   Diagnosis Date Noted  . Dyslipidemia 06/19/2017  . History of migraine 06/19/2017  . Spondylosis of lumbar region without myelopathy or radiculopathy 12/12/2016  . DJD (degenerative joint disease), cervical 12/12/2016  . DDD (degenerative disc disease), thoracic 12/12/2016  . Nausea & vomiting 11/04/2016  . Dizzinesses 11/04/2016  . Back pain 11/04/2016  . Sciatica 11/04/2016  . BMI 36.0-36.9,adult 11/04/2016  . Vitamin D deficiency   . Hyperlipidemia   . Fibromyalgia     Past Medical History:  Diagnosis Date  . DDD (degenerative disc disease), cervical   . DDD (degenerative disc disease), thoracic   . Fibromyalgia   . Fibromyalgia   . Hyperlipidemia   . Obesity   . Vitamin D deficiency     Family History  Problem Relation Age of Onset  . Congestive Heart Failure Mother   . Valvular heart disease Mother   . Alcohol abuse Father   . Cancer Sister        Breast and Colon  . Breast cancer Sister   . Cancer Maternal Uncle        lung  . Heart attack Maternal Grandmother   . Stroke Maternal Grandfather    Past Surgical History:  Procedure Laterality Date  . ABDOMINAL HYSTERECTOMY    . BACK SURGERY  01/28/2020   Per patient, Dr. 01/30/2020 in [place], [place]  . SPINE SURGERY     2015   Social History  Social History Narrative  . Not on file   Immunization History  Administered Date(s) Administered  . Moderna Sars-Covid-2 Vaccination 05/10/2020, 06/07/2020     Objective: Vital Signs: BP 136/86 (BP Location: Left Arm, Patient Position: Sitting, Cuff Size: Normal)   Pulse 85   Ht 5\' 4"  (1.626 m)   Wt 228 lb (103.4 kg)   BMI 39.14 kg/m    Physical Exam Vitals and nursing note reviewed.  Constitutional:      Appearance:  She is well-developed and well-nourished.  HENT:     Head: Normocephalic and atraumatic.  Eyes:     Extraocular Movements: EOM normal.     Conjunctiva/sclera: Conjunctivae normal.  Cardiovascular:     Rate and Rhythm: Normal rate and regular rhythm.     Pulses: Intact distal pulses.     Heart sounds: Normal heart sounds.  Pulmonary:     Effort: Pulmonary effort is normal.     Breath sounds: Normal breath sounds.  Abdominal:     General: Bowel sounds are normal.     Palpations: Abdomen is soft.  Musculoskeletal:     Cervical back: Normal range of motion.  Lymphadenopathy:     Cervical: No cervical adenopathy.  Skin:    General: Skin is warm and dry.     Capillary Refill: Capillary refill takes less than 2 seconds.  Neurological:     Mental Status: She is alert and oriented to person, place, and time.  Psychiatric:        Mood and Affect: Mood and affect normal.        Behavior: Behavior normal.      Musculoskeletal Exam: She has limited painful range of motion of her cervical spine.  She has discomfort elevated range of motion of thoracic and lumbar spine.  She had tenderness over SI joints.  Shoulder joints, elbow joints, wrist joints with good range of motion.  She has bilateral PIP and DIP thickening.  Hip joints with good range of motion.  She good range of motion of bilateral knee joints without any warmth swelling or effusion.  She has generalized hyperalgesia and positive tender points.  CDAI Exam: CDAI Score: -- Patient Global: --; Provider Global: -- Swollen: --; Tender: -- Joint Exam 09/01/2020   No joint exam has been documented for this visit   There is currently no information documented on the homunculus. Go to the Rheumatology activity and complete the homunculus joint exam.  Investigation: No additional findings.  Imaging: No results found.  Recent Labs: Lab Results  Component Value Date   WBC 8.0 11/28/2016   HGB 13.1 11/28/2016   PLT 287  11/28/2016   NA 141 11/28/2016   K 4.3 11/28/2016   CL 103 11/28/2016   CO2 25 11/28/2016   GLUCOSE 77 11/28/2016   BUN 10 11/28/2016   CREATININE 0.91 11/28/2016   BILITOT 0.9 11/28/2016   ALKPHOS 90 11/28/2016   AST 17 11/28/2016   ALT 13 11/28/2016   PROT 6.9 11/28/2016   ALBUMIN 3.9 11/28/2016   CALCIUM 9.4 11/28/2016   GFRAA 77 11/28/2016    Speciality Comments: No specialty comments available.  Procedures:  No procedures performed Allergies: Patient has no known allergies.   Assessment / Plan:     Visit Diagnoses: Primary osteoarthritis of both hands-she has bilateral PIP and DIP thickening with no synovitis.  She also has subluxation of some of her DIP joints.  Joint protection was discussed.  Chronic pain of both knees-patient states she had  recent x-rays at [facility] which were consistent with osteoarthritis.  DDD (degenerative disc disease), cervical-she has limited range of motion with discomfort.  DDD (degenerative disc disease), thoracic-she has chronic pain.  DDD (degenerative disc disease), lumbar - S/p discectomy and fusion per patient- at [facility].  Despite of surgery she is having ongoing discomfort in her lower back.  Water aerobics was emphasized.  Have advised her to discuss pain management with her orthopedic surgeon as she has significant pain despite taking all the medications.  Fibromyalgia-she has generalized pain, hyperalgesia and positive tender points.  Trochanteric bursitis of both hips-IT band stretches were discussed.  Vitamin D deficiency-she is on supplement.  Hx of migraines  History of gastroesophageal reflux (GERD)  Dyslipidemia  Orders: No orders of the defined types were placed in this encounter.  No orders of the defined types were placed in this encounter.     Follow-Up Instructions: Return in about 1 year (around 09/01/2021) for Osteoarthritis.   Pollyann Savoy, MD  Note - This record has been created using [employer].  Chart creation errors have been sought, but may not always  have been located. Such creation errors do not reflect on  the standard of medical care.

## 2020-09-01 ENCOUNTER — Ambulatory Visit (INDEPENDENT_AMBULATORY_CARE_PROVIDER_SITE_OTHER): Payer: 59 | Admitting: Rheumatology

## 2020-09-01 ENCOUNTER — Encounter: Payer: Self-pay | Admitting: Rheumatology

## 2020-09-01 ENCOUNTER — Other Ambulatory Visit: Payer: Self-pay

## 2020-09-01 VITALS — BP 136/86 | HR 85 | Ht 64.0 in | Wt 228.0 lb

## 2020-09-01 DIAGNOSIS — Z8669 Personal history of other diseases of the nervous system and sense organs: Secondary | ICD-10-CM | POA: Diagnosis not present

## 2020-09-01 DIAGNOSIS — G8929 Other chronic pain: Secondary | ICD-10-CM

## 2020-09-01 DIAGNOSIS — M5030 Other cervical disc degeneration, unspecified cervical region: Secondary | ICD-10-CM

## 2020-09-01 DIAGNOSIS — M25562 Pain in left knee: Secondary | ICD-10-CM

## 2020-09-01 DIAGNOSIS — M19041 Primary osteoarthritis, right hand: Secondary | ICD-10-CM

## 2020-09-01 DIAGNOSIS — M5136 Other intervertebral disc degeneration, lumbar region: Secondary | ICD-10-CM | POA: Diagnosis not present

## 2020-09-01 DIAGNOSIS — M25561 Pain in right knee: Secondary | ICD-10-CM | POA: Diagnosis not present

## 2020-09-01 DIAGNOSIS — M19042 Primary osteoarthritis, left hand: Secondary | ICD-10-CM | POA: Diagnosis not present

## 2020-09-01 DIAGNOSIS — E559 Vitamin D deficiency, unspecified: Secondary | ICD-10-CM | POA: Diagnosis not present

## 2020-09-01 DIAGNOSIS — M7061 Trochanteric bursitis, right hip: Secondary | ICD-10-CM

## 2020-09-01 DIAGNOSIS — M5134 Other intervertebral disc degeneration, thoracic region: Secondary | ICD-10-CM

## 2020-09-01 DIAGNOSIS — M7062 Trochanteric bursitis, left hip: Secondary | ICD-10-CM

## 2020-09-01 DIAGNOSIS — M797 Fibromyalgia: Secondary | ICD-10-CM | POA: Diagnosis not present

## 2020-09-01 DIAGNOSIS — Z8719 Personal history of other diseases of the digestive system: Secondary | ICD-10-CM

## 2020-09-01 DIAGNOSIS — E785 Hyperlipidemia, unspecified: Secondary | ICD-10-CM

## 2021-06-09 DIAGNOSIS — G96198 Other disorders of meninges, not elsewhere classified: Secondary | ICD-10-CM | POA: Diagnosis present

## 2021-06-09 DIAGNOSIS — R2689 Other abnormalities of gait and mobility: Secondary | ICD-10-CM | POA: Diagnosis present

## 2021-06-09 DIAGNOSIS — Z7982 Long term (current) use of aspirin: Secondary | ICD-10-CM | POA: Diagnosis not present

## 2021-06-09 DIAGNOSIS — K219 Gastro-esophageal reflux disease without esophagitis: Secondary | ICD-10-CM | POA: Diagnosis present

## 2021-06-09 DIAGNOSIS — Z9181 History of falling: Secondary | ICD-10-CM | POA: Diagnosis not present

## 2021-06-09 DIAGNOSIS — E669 Obesity, unspecified: Secondary | ICD-10-CM | POA: Diagnosis present

## 2021-06-09 DIAGNOSIS — M5136 Other intervertebral disc degeneration, lumbar region: Secondary | ICD-10-CM | POA: Diagnosis present

## 2021-06-09 DIAGNOSIS — M47817 Spondylosis without myelopathy or radiculopathy, lumbosacral region: Secondary | ICD-10-CM | POA: Diagnosis present

## 2021-06-09 DIAGNOSIS — M48061 Spinal stenosis, lumbar region without neurogenic claudication: Secondary | ICD-10-CM | POA: Diagnosis not present

## 2021-06-09 DIAGNOSIS — Z6837 Body mass index (BMI) 37.0-37.9, adult: Secondary | ICD-10-CM | POA: Diagnosis not present

## 2021-06-09 DIAGNOSIS — M797 Fibromyalgia: Secondary | ICD-10-CM | POA: Diagnosis present

## 2021-06-09 DIAGNOSIS — Z791 Long term (current) use of non-steroidal anti-inflammatories (NSAID): Secondary | ICD-10-CM | POA: Diagnosis not present

## 2021-06-09 DIAGNOSIS — M48062 Spinal stenosis, lumbar region with neurogenic claudication: Secondary | ICD-10-CM | POA: Diagnosis present

## 2021-06-09 DIAGNOSIS — Z8659 Personal history of other mental and behavioral disorders: Secondary | ICD-10-CM | POA: Diagnosis not present

## 2021-06-09 DIAGNOSIS — R531 Weakness: Secondary | ICD-10-CM | POA: Diagnosis present

## 2021-06-09 DIAGNOSIS — M47816 Spondylosis without myelopathy or radiculopathy, lumbar region: Secondary | ICD-10-CM | POA: Diagnosis present

## 2021-06-09 DIAGNOSIS — K5900 Constipation, unspecified: Secondary | ICD-10-CM | POA: Diagnosis present

## 2021-06-09 DIAGNOSIS — M4807 Spinal stenosis, lumbosacral region: Secondary | ICD-10-CM | POA: Diagnosis present

## 2021-06-09 DIAGNOSIS — Z79899 Other long term (current) drug therapy: Secondary | ICD-10-CM | POA: Diagnosis not present

## 2021-08-06 DIAGNOSIS — Z6837 Body mass index (BMI) 37.0-37.9, adult: Secondary | ICD-10-CM | POA: Diagnosis not present

## 2021-08-06 DIAGNOSIS — B9689 Other specified bacterial agents as the cause of diseases classified elsewhere: Secondary | ICD-10-CM | POA: Diagnosis not present

## 2021-08-06 DIAGNOSIS — J019 Acute sinusitis, unspecified: Secondary | ICD-10-CM | POA: Diagnosis not present

## 2021-08-06 DIAGNOSIS — M5136 Other intervertebral disc degeneration, lumbar region: Secondary | ICD-10-CM | POA: Diagnosis not present

## 2021-08-07 ENCOUNTER — Telehealth: Payer: Self-pay | Admitting: Rheumatology

## 2021-08-07 NOTE — Telephone Encounter (Signed)
Patient advised she may discuss it with Dr. Corliss Skains at her follow up visit on 08/31/2021. Patient has not been seen since 2021. Patient expressed understanding.

## 2021-08-07 NOTE — Telephone Encounter (Signed)
Patient callin g to see if Dr. Corliss Skains would be able to provide a physician statement for her disability paperwork she is filling out. Patient needs statemant from doctor in regards to her diagnosis, and conditions she has to deal with, along with treatment dates, etc. Please call to advise.

## 2021-08-17 NOTE — Progress Notes (Deleted)
Office Visit Note  Patient: Bridget Hampton             Date of Birth: [birth year]           MRN: [id number]             PCP: Patient, No Pcp Per (Inactive) Referring: No ref. provider found Visit Date: 08/31/2021 Occupation: @GUAROCC @  Subjective:  No chief complaint on file.   History of Present Illness: Bridget Hampton is a 70 y.o. female ***   Activities of Daily Living:  Patient reports morning stiffness for *** {minute/hour:19697}.   Patient {ACTIONS;DENIES/REPORTS:21021675::"Denies"} nocturnal pain.  Difficulty dressing/grooming: {ACTIONS;DENIES/REPORTS:21021675::"Denies"} Difficulty climbing stairs: {ACTIONS;DENIES/REPORTS:21021675::"Denies"} Difficulty getting out of chair: {ACTIONS;DENIES/REPORTS:21021675::"Denies"} Difficulty using hands for taps, buttons, cutlery, and/or writing: {ACTIONS;DENIES/REPORTS:21021675::"Denies"}  No Rheumatology ROS completed.   PMFS History:  Patient Active Problem List   Diagnosis Date Noted   Dyslipidemia 06/19/2017   History of migraine 06/19/2017   Spondylosis of lumbar region without myelopathy or radiculopathy 12/12/2016   DJD (degenerative joint disease), cervical 12/12/2016   DDD (degenerative disc disease), thoracic 12/12/2016   Nausea & vomiting 11/04/2016   Dizzinesses 11/04/2016   Back pain 11/04/2016   Sciatica 11/04/2016   BMI 36.0-36.9,adult 11/04/2016   Vitamin D deficiency    Hyperlipidemia    Fibromyalgia     Past Medical History:  Diagnosis Date   DDD (degenerative disc disease), cervical    DDD (degenerative disc disease), thoracic    Fibromyalgia    Fibromyalgia    Hyperlipidemia    Obesity    Vitamin D deficiency     Family History  Problem Relation Age of Onset   Congestive Heart Failure Mother    Valvular heart disease Mother    Alcohol abuse Father    Cancer Sister        Breast and Colon   Breast cancer Sister    Cancer Maternal Uncle        lung   Heart attack Maternal Grandmother    Stroke  Maternal Grandfather    Past Surgical History:  Procedure Laterality Date   ABDOMINAL HYSTERECTOMY     BACK SURGERY  01/28/2020   Per patient, Dr. Manson Passey in [place], [place]   SPINE SURGERY     2015   Social History   Social History Narrative   Not on file   Immunization History  Administered Date(s) Administered   Moderna Sars-Covid-2 Vaccination 05/10/2020, 06/07/2020     Objective: Vital Signs: There were no vitals taken for this visit.   Physical Exam   Musculoskeletal Exam: ***  CDAI Exam: CDAI Score: -- Patient Global: --; Provider Global: -- Swollen: --; Tender: -- Joint Exam 08/31/2021   No joint exam has been documented for this visit   There is currently no information documented on the homunculus. Go to the Rheumatology activity and complete the homunculus joint exam.  Investigation: No additional findings.  Imaging: No results found.  Recent Labs: Lab Results  Component Value Date   WBC 8.0 11/28/2016   HGB 13.1 11/28/2016   PLT 287 11/28/2016   NA 141 11/28/2016   K 4.3 11/28/2016   CL 103 11/28/2016   CO2 25 11/28/2016   GLUCOSE 77 11/28/2016   BUN 10 11/28/2016   CREATININE 0.91 11/28/2016   BILITOT 0.9 11/28/2016   ALKPHOS 90 11/28/2016   AST 17 11/28/2016   ALT 13 11/28/2016   PROT 6.9 11/28/2016   ALBUMIN 3.9 11/28/2016   CALCIUM 9.4 11/28/2016  GFRAA 77 11/28/2016    Speciality Comments: No specialty comments available.  Procedures:  No procedures performed Allergies: Patient has no known allergies.   Assessment / Plan:     Visit Diagnoses: No diagnosis found.  Orders: No orders of the defined types were placed in this encounter.  No orders of the defined types were placed in this encounter.   Face-to-face time spent with patient was *** minutes. Greater than 50% of time was spent in counseling and coordination of care.  Follow-Up Instructions: No follow-ups on file.   Ellen Henri, CMA  Note - This record  has been created using Animal nutritionist.  Chart creation errors have been sought, but may not always  have been located. Such creation errors do not reflect on  the standard of medical care.

## 2021-08-31 ENCOUNTER — Ambulatory Visit: Payer: 59 | Admitting: Rheumatology

## 2021-08-31 DIAGNOSIS — M797 Fibromyalgia: Secondary | ICD-10-CM

## 2021-08-31 DIAGNOSIS — M5134 Other intervertebral disc degeneration, thoracic region: Secondary | ICD-10-CM

## 2021-08-31 DIAGNOSIS — Z8719 Personal history of other diseases of the digestive system: Secondary | ICD-10-CM

## 2021-08-31 DIAGNOSIS — E785 Hyperlipidemia, unspecified: Secondary | ICD-10-CM

## 2021-08-31 DIAGNOSIS — M5030 Other cervical disc degeneration, unspecified cervical region: Secondary | ICD-10-CM

## 2021-08-31 DIAGNOSIS — Z8669 Personal history of other diseases of the nervous system and sense organs: Secondary | ICD-10-CM

## 2021-08-31 DIAGNOSIS — E559 Vitamin D deficiency, unspecified: Secondary | ICD-10-CM

## 2021-08-31 DIAGNOSIS — M5136 Other intervertebral disc degeneration, lumbar region: Secondary | ICD-10-CM

## 2021-08-31 DIAGNOSIS — M19041 Primary osteoarthritis, right hand: Secondary | ICD-10-CM

## 2021-08-31 DIAGNOSIS — M7061 Trochanteric bursitis, right hip: Secondary | ICD-10-CM

## 2021-08-31 DIAGNOSIS — G8929 Other chronic pain: Secondary | ICD-10-CM

## 2021-09-05 DIAGNOSIS — Z0000 Encounter for general adult medical examination without abnormal findings: Secondary | ICD-10-CM | POA: Diagnosis not present

## 2021-09-05 DIAGNOSIS — Z79899 Other long term (current) drug therapy: Secondary | ICD-10-CM | POA: Diagnosis not present

## 2021-09-05 DIAGNOSIS — Z6837 Body mass index (BMI) 37.0-37.9, adult: Secondary | ICD-10-CM | POA: Diagnosis not present

## 2021-11-13 DIAGNOSIS — J3489 Other specified disorders of nose and nasal sinuses: Secondary | ICD-10-CM | POA: Diagnosis not present

## 2021-11-13 DIAGNOSIS — K219 Gastro-esophageal reflux disease without esophagitis: Secondary | ICD-10-CM | POA: Diagnosis not present

## 2021-11-27 DIAGNOSIS — Z20822 Contact with and (suspected) exposure to covid-19: Secondary | ICD-10-CM | POA: Diagnosis not present

## 2022-01-04 DIAGNOSIS — M5442 Lumbago with sciatica, left side: Secondary | ICD-10-CM | POA: Diagnosis not present

## 2022-01-04 DIAGNOSIS — Z1331 Encounter for screening for depression: Secondary | ICD-10-CM | POA: Diagnosis not present

## 2022-01-04 DIAGNOSIS — J302 Other seasonal allergic rhinitis: Secondary | ICD-10-CM | POA: Diagnosis not present

## 2022-01-04 DIAGNOSIS — M5441 Lumbago with sciatica, right side: Secondary | ICD-10-CM | POA: Diagnosis not present

## 2022-01-17 DIAGNOSIS — Z20822 Contact with and (suspected) exposure to covid-19: Secondary | ICD-10-CM | POA: Diagnosis not present

## 2022-01-29 DIAGNOSIS — M549 Dorsalgia, unspecified: Secondary | ICD-10-CM | POA: Diagnosis not present

## 2022-01-29 DIAGNOSIS — M79605 Pain in left leg: Secondary | ICD-10-CM | POA: Diagnosis not present

## 2022-01-29 DIAGNOSIS — M79604 Pain in right leg: Secondary | ICD-10-CM | POA: Diagnosis not present

## 2022-02-13 DIAGNOSIS — M48061 Spinal stenosis, lumbar region without neurogenic claudication: Secondary | ICD-10-CM | POA: Diagnosis not present

## 2022-02-13 DIAGNOSIS — Z9889 Other specified postprocedural states: Secondary | ICD-10-CM | POA: Diagnosis not present

## 2022-02-13 DIAGNOSIS — M47816 Spondylosis without myelopathy or radiculopathy, lumbar region: Secondary | ICD-10-CM | POA: Diagnosis not present

## 2022-02-13 DIAGNOSIS — M47817 Spondylosis without myelopathy or radiculopathy, lumbosacral region: Secondary | ICD-10-CM | POA: Diagnosis not present

## 2022-02-13 DIAGNOSIS — M4807 Spinal stenosis, lumbosacral region: Secondary | ICD-10-CM | POA: Diagnosis not present

## 2022-03-07 DIAGNOSIS — M5136 Other intervertebral disc degeneration, lumbar region: Secondary | ICD-10-CM | POA: Diagnosis not present

## 2022-03-07 DIAGNOSIS — M5416 Radiculopathy, lumbar region: Secondary | ICD-10-CM | POA: Diagnosis not present

## 2022-03-14 DIAGNOSIS — R051 Acute cough: Secondary | ICD-10-CM | POA: Diagnosis not present

## 2022-03-14 DIAGNOSIS — R0981 Nasal congestion: Secondary | ICD-10-CM | POA: Diagnosis not present

## 2022-03-14 DIAGNOSIS — J00 Acute nasopharyngitis [common cold]: Secondary | ICD-10-CM | POA: Diagnosis not present

## 2022-03-14 DIAGNOSIS — M5442 Lumbago with sciatica, left side: Secondary | ICD-10-CM | POA: Diagnosis not present

## 2022-03-26 DIAGNOSIS — M961 Postlaminectomy syndrome, not elsewhere classified: Secondary | ICD-10-CM | POA: Diagnosis not present

## 2022-03-26 DIAGNOSIS — M5136 Other intervertebral disc degeneration, lumbar region: Secondary | ICD-10-CM | POA: Diagnosis not present

## 2022-03-26 DIAGNOSIS — M533 Sacrococcygeal disorders, not elsewhere classified: Secondary | ICD-10-CM | POA: Diagnosis not present

## 2022-03-26 DIAGNOSIS — M5416 Radiculopathy, lumbar region: Secondary | ICD-10-CM | POA: Diagnosis not present

## 2022-03-26 DIAGNOSIS — M48062 Spinal stenosis, lumbar region with neurogenic claudication: Secondary | ICD-10-CM | POA: Diagnosis not present

## 2022-03-30 IMAGING — CR DG KNEE COMPLETE 4+V*R*
4 series · 4 of 4 positions shown · non-contrast
Comparison: None.

CLINICAL DATA: Pain in both knees

EXAM:
RIGHT KNEE - COMPLETE 4+ VIEW

[t knee oblique right (1 of 3)]
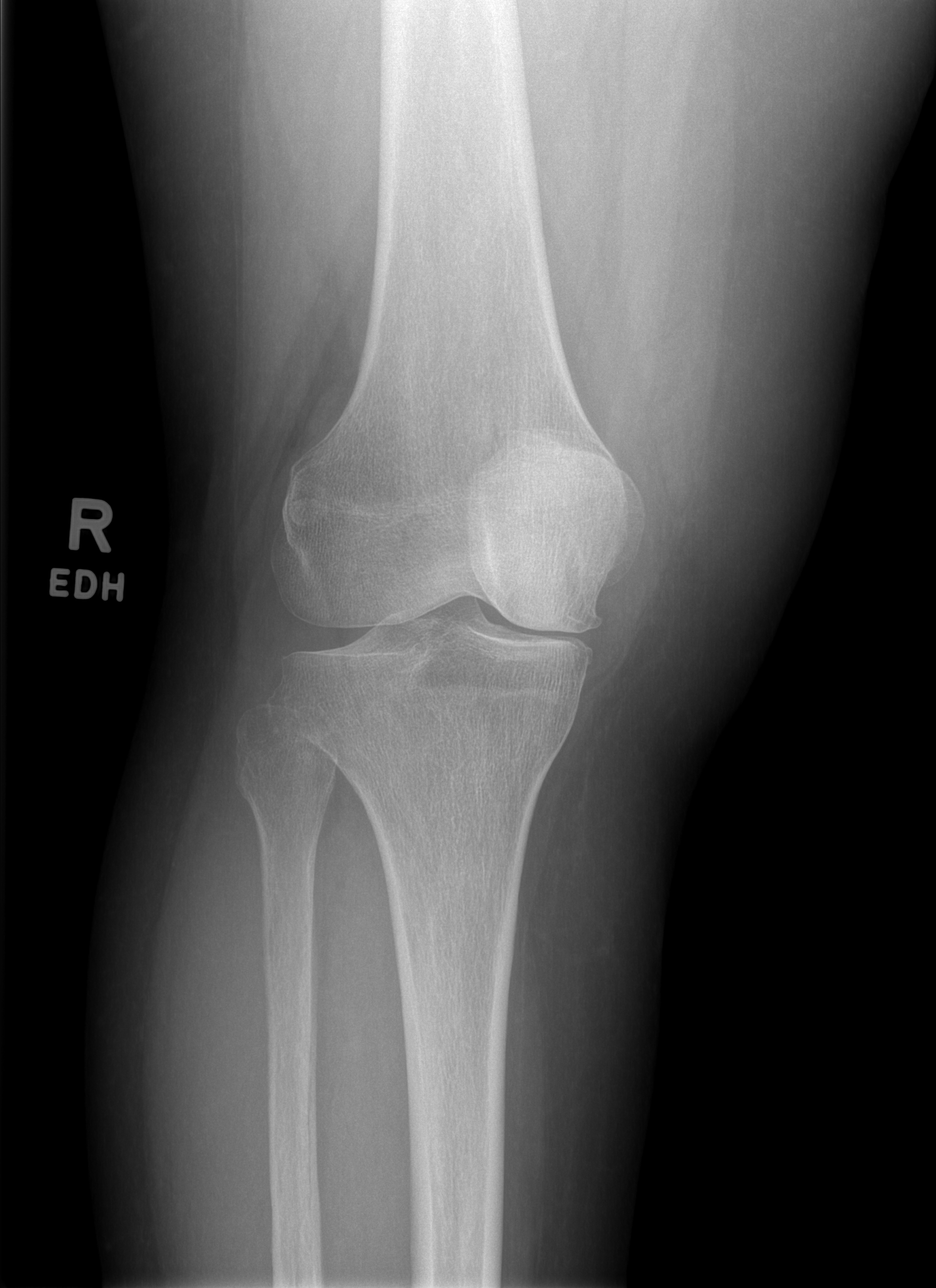

[t knee oblique right (2 of 3)]
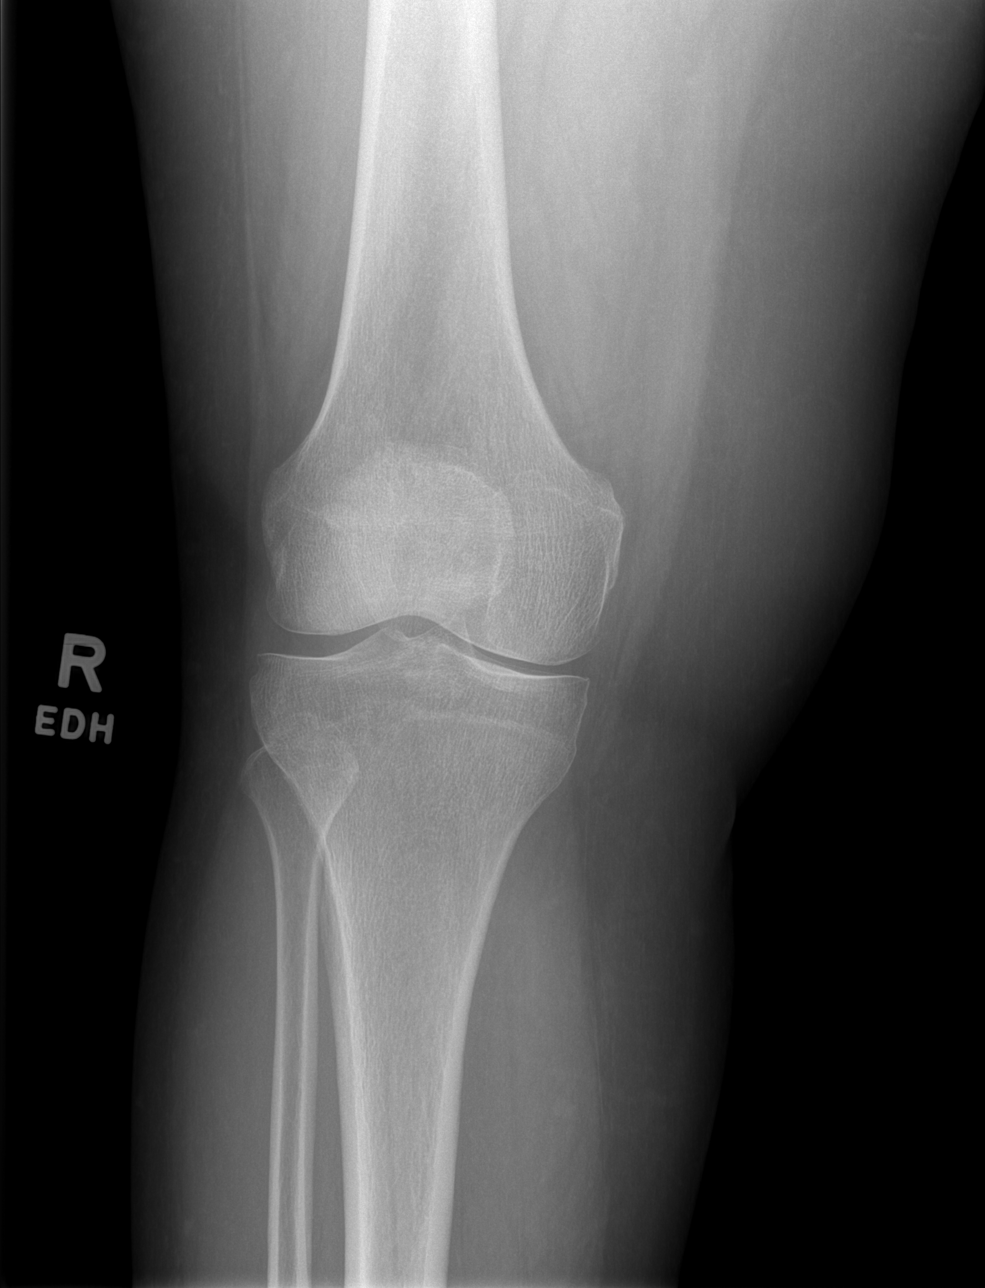

[t knee oblique right (3 of 3)]
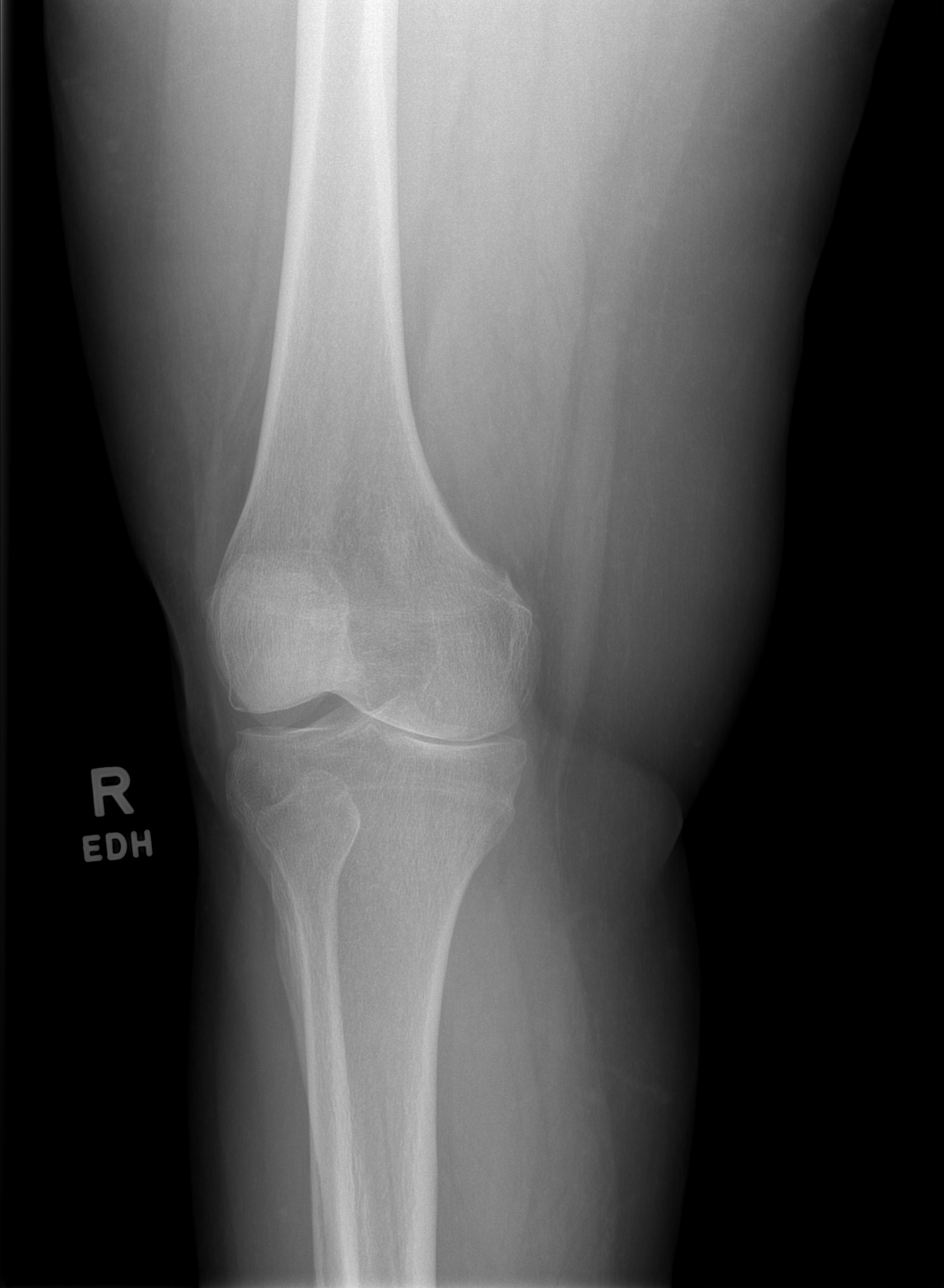

[t knee lat right]
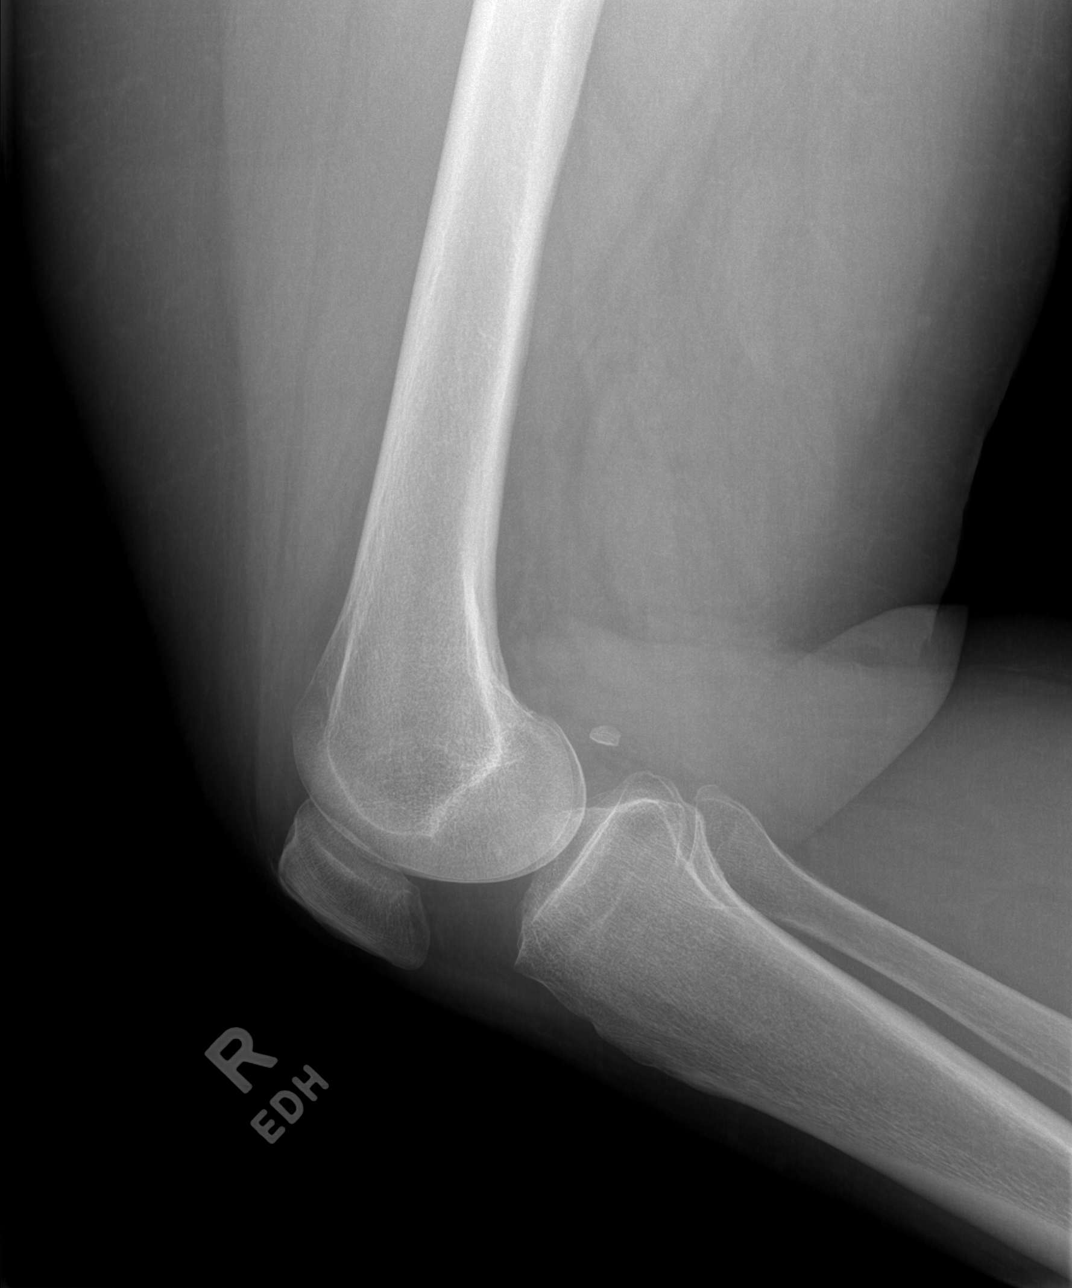

[4 of 4 positions shown; findings below may reference images not displayed]

FINDINGS: No fracture or malalignment. Mild medial joint space degenerative
change. No significant effusion
IMPRESSION: Mild degenerative changes.

## 2022-03-30 IMAGING — CR DG KNEE COMPLETE 4+V*L*
4 series · 4 of 4 positions shown · non-contrast
Comparison: None.

CLINICAL DATA: Bilateral knee pain

EXAM:
LEFT KNEE - COMPLETE 4+ VIEW

[t knee ap left]
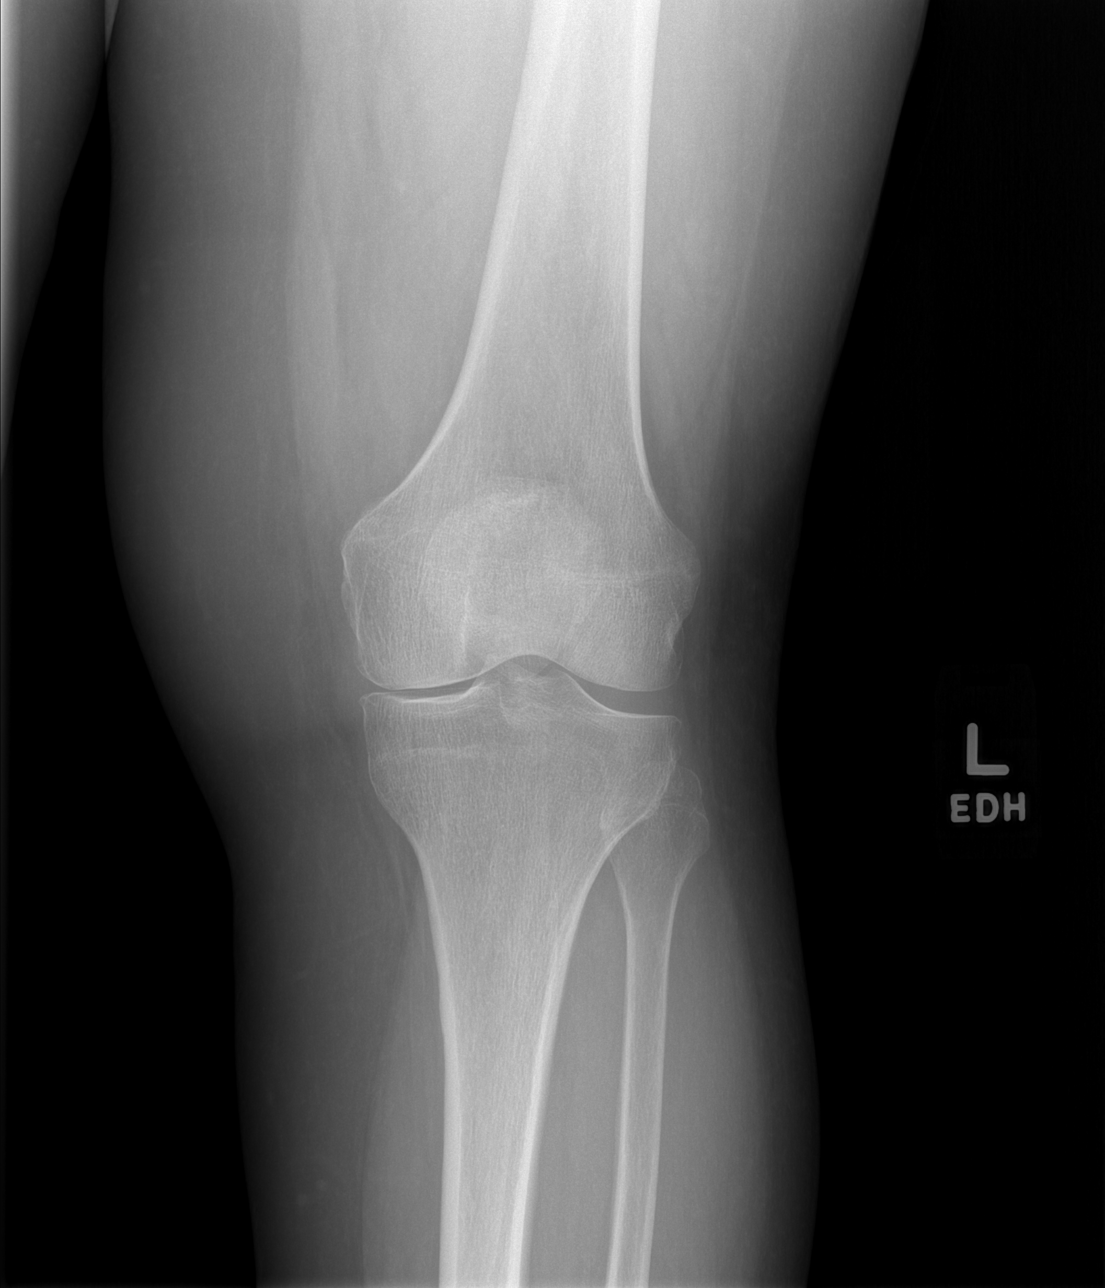

[t knee oblique left (1 of 2)]
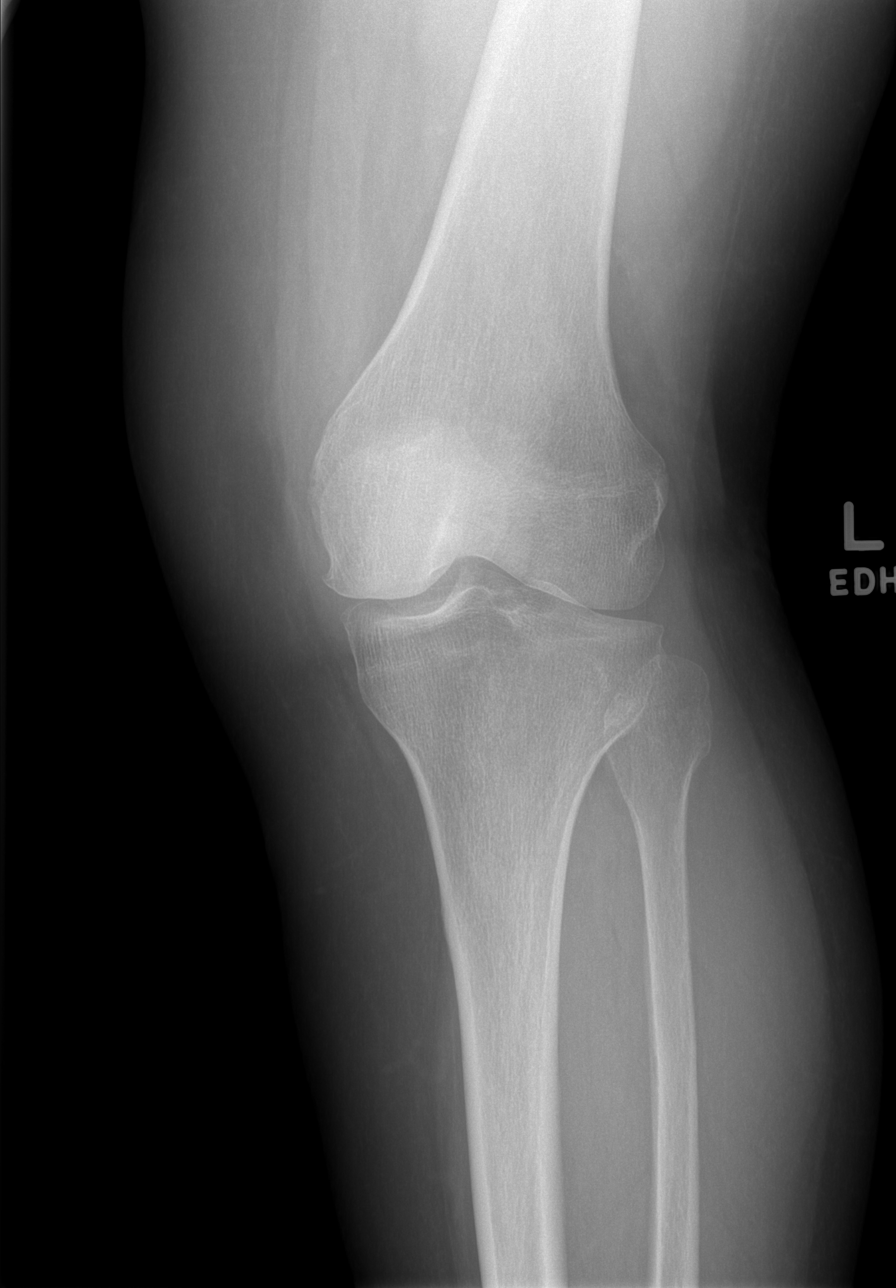

[t knee oblique left (2 of 2)]
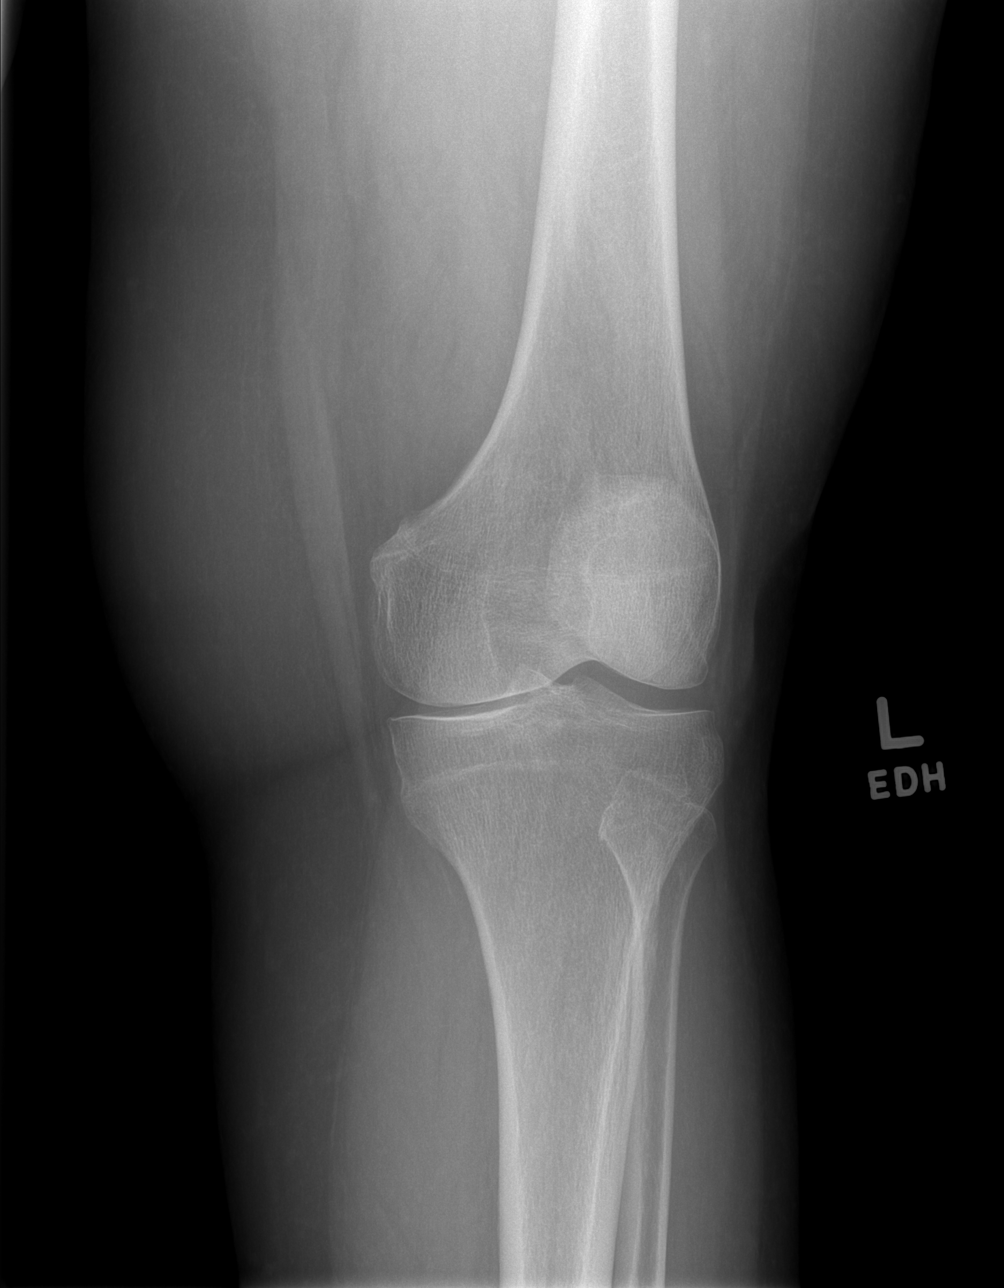

[t knee lat left]
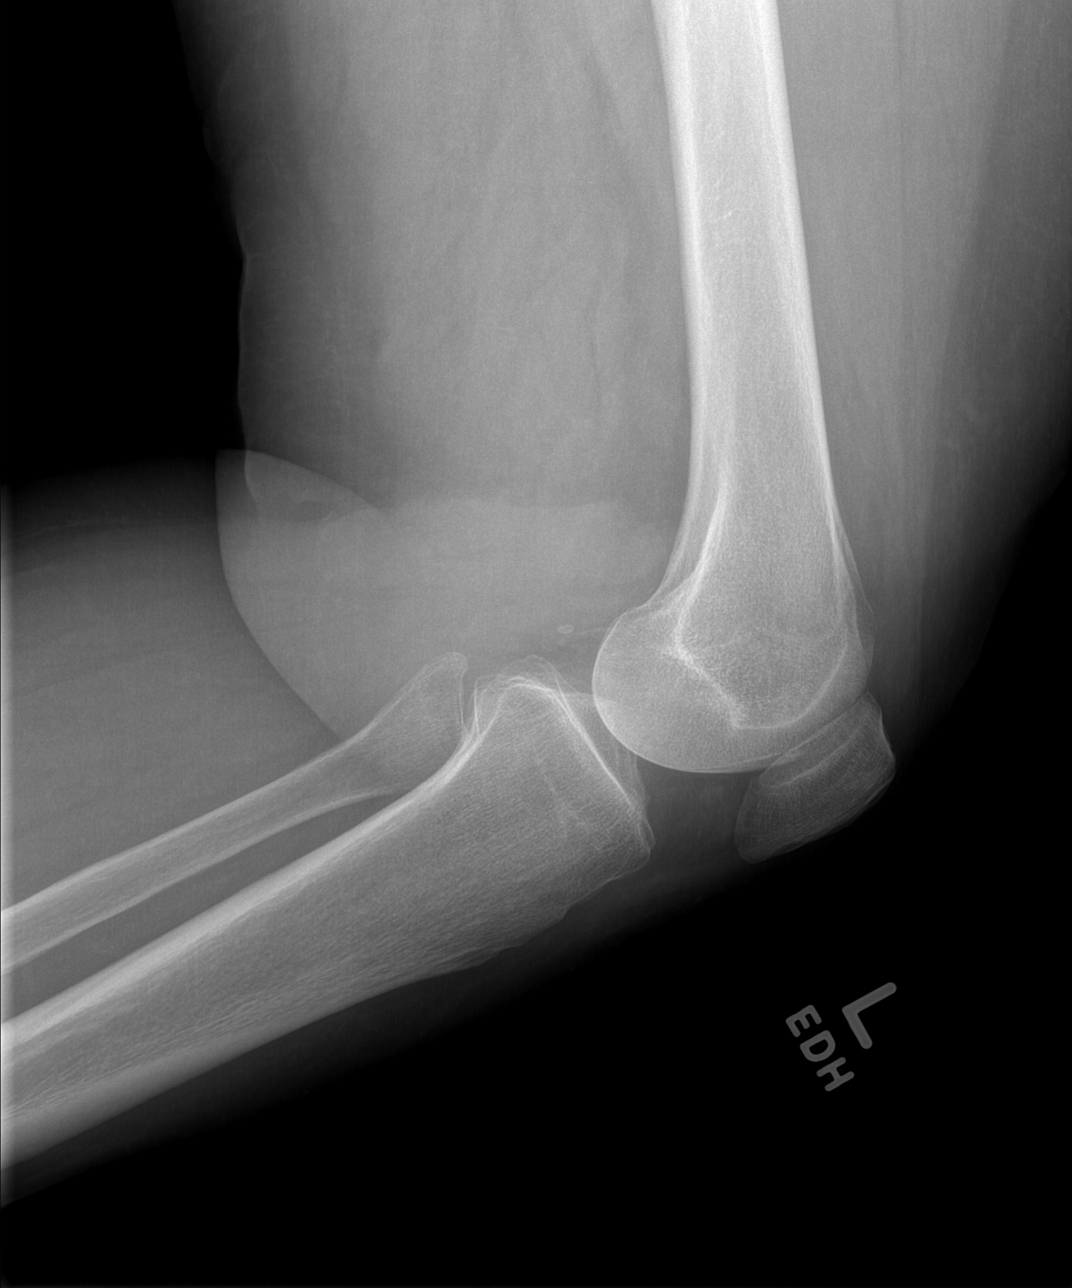

[4 of 4 positions shown; findings below may reference images not displayed]

FINDINGS: No fracture or malalignment. Mild medial joint space degenerative
change. No large knee effusion
IMPRESSION: Minimal degenerative change. No acute osseous abnormality.

## 2022-04-10 DIAGNOSIS — M48062 Spinal stenosis, lumbar region with neurogenic claudication: Secondary | ICD-10-CM | POA: Diagnosis not present

## 2022-04-10 DIAGNOSIS — M5416 Radiculopathy, lumbar region: Secondary | ICD-10-CM | POA: Diagnosis not present

## 2022-04-10 DIAGNOSIS — R293 Abnormal posture: Secondary | ICD-10-CM | POA: Diagnosis not present

## 2022-04-10 DIAGNOSIS — R2689 Other abnormalities of gait and mobility: Secondary | ICD-10-CM | POA: Diagnosis not present

## 2022-04-10 DIAGNOSIS — M6289 Other specified disorders of muscle: Secondary | ICD-10-CM | POA: Diagnosis not present

## 2022-04-10 DIAGNOSIS — M5136 Other intervertebral disc degeneration, lumbar region: Secondary | ICD-10-CM | POA: Diagnosis not present

## 2022-04-10 DIAGNOSIS — M6281 Muscle weakness (generalized): Secondary | ICD-10-CM | POA: Diagnosis not present

## 2022-04-10 DIAGNOSIS — M533 Sacrococcygeal disorders, not elsewhere classified: Secondary | ICD-10-CM | POA: Diagnosis not present

## 2022-04-10 DIAGNOSIS — M961 Postlaminectomy syndrome, not elsewhere classified: Secondary | ICD-10-CM | POA: Diagnosis not present

## 2022-04-30 DIAGNOSIS — M5416 Radiculopathy, lumbar region: Secondary | ICD-10-CM | POA: Diagnosis not present

## 2022-04-30 DIAGNOSIS — Z4789 Encounter for other orthopedic aftercare: Secondary | ICD-10-CM | POA: Diagnosis not present

## 2022-04-30 DIAGNOSIS — M5136 Other intervertebral disc degeneration, lumbar region: Secondary | ICD-10-CM | POA: Diagnosis not present

## 2022-04-30 DIAGNOSIS — M961 Postlaminectomy syndrome, not elsewhere classified: Secondary | ICD-10-CM | POA: Diagnosis not present

## 2022-04-30 DIAGNOSIS — M48062 Spinal stenosis, lumbar region with neurogenic claudication: Secondary | ICD-10-CM | POA: Diagnosis not present

## 2022-04-30 DIAGNOSIS — M533 Sacrococcygeal disorders, not elsewhere classified: Secondary | ICD-10-CM | POA: Diagnosis not present

## 2022-05-02 DIAGNOSIS — Z4789 Encounter for other orthopedic aftercare: Secondary | ICD-10-CM | POA: Diagnosis not present

## 2022-05-02 DIAGNOSIS — M533 Sacrococcygeal disorders, not elsewhere classified: Secondary | ICD-10-CM | POA: Diagnosis not present

## 2022-05-02 DIAGNOSIS — M5416 Radiculopathy, lumbar region: Secondary | ICD-10-CM | POA: Diagnosis not present

## 2022-05-02 DIAGNOSIS — M48062 Spinal stenosis, lumbar region with neurogenic claudication: Secondary | ICD-10-CM | POA: Diagnosis not present

## 2022-05-02 DIAGNOSIS — M961 Postlaminectomy syndrome, not elsewhere classified: Secondary | ICD-10-CM | POA: Diagnosis not present

## 2022-05-02 DIAGNOSIS — M5136 Other intervertebral disc degeneration, lumbar region: Secondary | ICD-10-CM | POA: Diagnosis not present

## 2022-05-03 DIAGNOSIS — L819 Disorder of pigmentation, unspecified: Secondary | ICD-10-CM | POA: Diagnosis not present

## 2022-05-03 DIAGNOSIS — L570 Actinic keratosis: Secondary | ICD-10-CM | POA: Diagnosis not present

## 2022-05-03 DIAGNOSIS — L814 Other melanin hyperpigmentation: Secondary | ICD-10-CM | POA: Diagnosis not present

## 2022-05-03 DIAGNOSIS — L821 Other seborrheic keratosis: Secondary | ICD-10-CM | POA: Diagnosis not present

## 2022-05-06 DIAGNOSIS — M5416 Radiculopathy, lumbar region: Secondary | ICD-10-CM | POA: Diagnosis not present

## 2022-05-06 DIAGNOSIS — M533 Sacrococcygeal disorders, not elsewhere classified: Secondary | ICD-10-CM | POA: Diagnosis not present

## 2022-05-06 DIAGNOSIS — M961 Postlaminectomy syndrome, not elsewhere classified: Secondary | ICD-10-CM | POA: Diagnosis not present

## 2022-05-06 DIAGNOSIS — Z4789 Encounter for other orthopedic aftercare: Secondary | ICD-10-CM | POA: Diagnosis not present

## 2022-05-06 DIAGNOSIS — M5136 Other intervertebral disc degeneration, lumbar region: Secondary | ICD-10-CM | POA: Diagnosis not present

## 2022-05-06 DIAGNOSIS — M48062 Spinal stenosis, lumbar region with neurogenic claudication: Secondary | ICD-10-CM | POA: Diagnosis not present

## 2022-05-07 DIAGNOSIS — T8149XA Infection following a procedure, other surgical site, initial encounter: Secondary | ICD-10-CM | POA: Diagnosis not present

## 2022-05-07 DIAGNOSIS — Z6836 Body mass index (BMI) 36.0-36.9, adult: Secondary | ICD-10-CM | POA: Diagnosis not present

## 2022-05-09 DIAGNOSIS — Z6836 Body mass index (BMI) 36.0-36.9, adult: Secondary | ICD-10-CM | POA: Diagnosis not present

## 2022-05-09 DIAGNOSIS — T8149XA Infection following a procedure, other surgical site, initial encounter: Secondary | ICD-10-CM | POA: Diagnosis not present

## 2022-05-09 DIAGNOSIS — L814 Other melanin hyperpigmentation: Secondary | ICD-10-CM | POA: Diagnosis not present

## 2022-05-13 DIAGNOSIS — T8149XA Infection following a procedure, other surgical site, initial encounter: Secondary | ICD-10-CM | POA: Diagnosis not present

## 2022-05-13 DIAGNOSIS — Z6835 Body mass index (BMI) 35.0-35.9, adult: Secondary | ICD-10-CM | POA: Diagnosis not present

## 2022-05-16 DIAGNOSIS — L814 Other melanin hyperpigmentation: Secondary | ICD-10-CM | POA: Diagnosis not present

## 2022-05-16 DIAGNOSIS — Z6835 Body mass index (BMI) 35.0-35.9, adult: Secondary | ICD-10-CM | POA: Diagnosis not present

## 2022-05-16 DIAGNOSIS — T8149XA Infection following a procedure, other surgical site, initial encounter: Secondary | ICD-10-CM | POA: Diagnosis not present

## 2022-05-21 DIAGNOSIS — M961 Postlaminectomy syndrome, not elsewhere classified: Secondary | ICD-10-CM | POA: Diagnosis not present

## 2022-05-21 DIAGNOSIS — R2689 Other abnormalities of gait and mobility: Secondary | ICD-10-CM | POA: Diagnosis not present

## 2022-05-21 DIAGNOSIS — Z4789 Encounter for other orthopedic aftercare: Secondary | ICD-10-CM | POA: Diagnosis not present

## 2022-05-21 DIAGNOSIS — M5416 Radiculopathy, lumbar region: Secondary | ICD-10-CM | POA: Diagnosis not present

## 2022-05-23 DIAGNOSIS — Z4789 Encounter for other orthopedic aftercare: Secondary | ICD-10-CM | POA: Diagnosis not present

## 2022-05-23 DIAGNOSIS — M961 Postlaminectomy syndrome, not elsewhere classified: Secondary | ICD-10-CM | POA: Diagnosis not present

## 2022-05-23 DIAGNOSIS — M5416 Radiculopathy, lumbar region: Secondary | ICD-10-CM | POA: Diagnosis not present

## 2022-05-23 DIAGNOSIS — R2689 Other abnormalities of gait and mobility: Secondary | ICD-10-CM | POA: Diagnosis not present

## 2022-06-03 ENCOUNTER — Encounter: Payer: Self-pay | Admitting: Nurse Practitioner

## 2022-06-03 NOTE — Progress Notes (Signed)
This encounter was created in error - please disregard.

## 2022-06-05 DIAGNOSIS — M5136 Other intervertebral disc degeneration, lumbar region: Secondary | ICD-10-CM | POA: Diagnosis not present

## 2022-06-05 DIAGNOSIS — M5416 Radiculopathy, lumbar region: Secondary | ICD-10-CM | POA: Diagnosis not present

## 2022-06-05 DIAGNOSIS — M961 Postlaminectomy syndrome, not elsewhere classified: Secondary | ICD-10-CM | POA: Diagnosis not present

## 2022-06-17 DIAGNOSIS — L570 Actinic keratosis: Secondary | ICD-10-CM | POA: Diagnosis not present

## 2022-06-17 DIAGNOSIS — L821 Other seborrheic keratosis: Secondary | ICD-10-CM | POA: Diagnosis not present

## 2022-07-03 DIAGNOSIS — M961 Postlaminectomy syndrome, not elsewhere classified: Secondary | ICD-10-CM | POA: Diagnosis not present

## 2022-07-03 DIAGNOSIS — M5416 Radiculopathy, lumbar region: Secondary | ICD-10-CM | POA: Diagnosis not present

## 2022-07-03 DIAGNOSIS — M533 Sacrococcygeal disorders, not elsewhere classified: Secondary | ICD-10-CM | POA: Diagnosis not present

## 2022-07-03 DIAGNOSIS — M5136 Other intervertebral disc degeneration, lumbar region: Secondary | ICD-10-CM | POA: Diagnosis not present

## 2022-07-03 DIAGNOSIS — M48062 Spinal stenosis, lumbar region with neurogenic claudication: Secondary | ICD-10-CM | POA: Diagnosis not present

## 2022-08-11 DIAGNOSIS — D8481 Immunodeficiency due to conditions classified elsewhere: Secondary | ICD-10-CM | POA: Diagnosis not present

## 2022-08-11 DIAGNOSIS — K5190 Ulcerative colitis, unspecified, without complications: Secondary | ICD-10-CM | POA: Diagnosis not present

## 2022-08-11 DIAGNOSIS — M797 Fibromyalgia: Secondary | ICD-10-CM | POA: Diagnosis not present

## 2022-08-11 DIAGNOSIS — Z840 Family history of diseases of the skin and subcutaneous tissue: Secondary | ICD-10-CM | POA: Diagnosis not present

## 2022-09-04 DIAGNOSIS — M461 Sacroiliitis, not elsewhere classified: Secondary | ICD-10-CM | POA: Diagnosis not present

## 2022-10-09 DIAGNOSIS — G8929 Other chronic pain: Secondary | ICD-10-CM | POA: Diagnosis not present

## 2022-10-09 DIAGNOSIS — Z6832 Body mass index (BMI) 32.0-32.9, adult: Secondary | ICD-10-CM | POA: Diagnosis not present

## 2022-10-09 DIAGNOSIS — M545 Low back pain, unspecified: Secondary | ICD-10-CM | POA: Diagnosis not present

## 2022-10-09 DIAGNOSIS — Z1159 Encounter for screening for other viral diseases: Secondary | ICD-10-CM | POA: Diagnosis not present

## 2022-10-09 DIAGNOSIS — R030 Elevated blood-pressure reading, without diagnosis of hypertension: Secondary | ICD-10-CM | POA: Diagnosis not present

## 2022-10-09 DIAGNOSIS — Z79899 Other long term (current) drug therapy: Secondary | ICD-10-CM | POA: Diagnosis not present

## 2022-10-09 DIAGNOSIS — E7800 Pure hypercholesterolemia, unspecified: Secondary | ICD-10-CM | POA: Diagnosis not present

## 2022-10-09 DIAGNOSIS — Z131 Encounter for screening for diabetes mellitus: Secondary | ICD-10-CM | POA: Diagnosis not present

## 2022-10-09 DIAGNOSIS — R5383 Other fatigue: Secondary | ICD-10-CM | POA: Diagnosis not present

## 2022-10-09 DIAGNOSIS — Z0000 Encounter for general adult medical examination without abnormal findings: Secondary | ICD-10-CM | POA: Diagnosis not present

## 2022-10-09 DIAGNOSIS — E559 Vitamin D deficiency, unspecified: Secondary | ICD-10-CM | POA: Diagnosis not present

## 2022-10-09 DIAGNOSIS — M129 Arthropathy, unspecified: Secondary | ICD-10-CM | POA: Diagnosis not present

## 2022-10-24 DIAGNOSIS — G8929 Other chronic pain: Secondary | ICD-10-CM | POA: Diagnosis not present

## 2022-10-24 DIAGNOSIS — E559 Vitamin D deficiency, unspecified: Secondary | ICD-10-CM | POA: Diagnosis not present

## 2022-10-24 DIAGNOSIS — M545 Low back pain, unspecified: Secondary | ICD-10-CM | POA: Diagnosis not present

## 2022-10-24 DIAGNOSIS — Z6832 Body mass index (BMI) 32.0-32.9, adult: Secondary | ICD-10-CM | POA: Diagnosis not present

## 2022-10-24 DIAGNOSIS — E7800 Pure hypercholesterolemia, unspecified: Secondary | ICD-10-CM | POA: Diagnosis not present

## 2022-11-05 DIAGNOSIS — R1319 Other dysphagia: Secondary | ICD-10-CM | POA: Diagnosis not present

## 2022-11-05 DIAGNOSIS — Z1211 Encounter for screening for malignant neoplasm of colon: Secondary | ICD-10-CM | POA: Diagnosis not present

## 2022-11-05 DIAGNOSIS — Z83719 Family history of colon polyps, unspecified: Secondary | ICD-10-CM | POA: Diagnosis not present

## 2022-11-26 DIAGNOSIS — G8929 Other chronic pain: Secondary | ICD-10-CM | POA: Diagnosis not present

## 2022-11-26 DIAGNOSIS — M545 Low back pain, unspecified: Secondary | ICD-10-CM | POA: Diagnosis not present

## 2022-11-26 DIAGNOSIS — E559 Vitamin D deficiency, unspecified: Secondary | ICD-10-CM | POA: Diagnosis not present

## 2022-11-26 DIAGNOSIS — Z6832 Body mass index (BMI) 32.0-32.9, adult: Secondary | ICD-10-CM | POA: Diagnosis not present

## 2022-11-26 DIAGNOSIS — E7800 Pure hypercholesterolemia, unspecified: Secondary | ICD-10-CM | POA: Diagnosis not present

## 2022-11-29 DIAGNOSIS — H2513 Age-related nuclear cataract, bilateral: Secondary | ICD-10-CM | POA: Diagnosis not present

## 2022-11-29 DIAGNOSIS — H52223 Regular astigmatism, bilateral: Secondary | ICD-10-CM | POA: Diagnosis not present

## 2022-11-29 DIAGNOSIS — H5203 Hypermetropia, bilateral: Secondary | ICD-10-CM | POA: Diagnosis not present

## 2022-11-29 DIAGNOSIS — H524 Presbyopia: Secondary | ICD-10-CM | POA: Diagnosis not present

## 2022-12-17 DIAGNOSIS — K5900 Constipation, unspecified: Secondary | ICD-10-CM | POA: Diagnosis not present

## 2022-12-17 DIAGNOSIS — K219 Gastro-esophageal reflux disease without esophagitis: Secondary | ICD-10-CM | POA: Diagnosis not present

## 2022-12-17 DIAGNOSIS — R1310 Dysphagia, unspecified: Secondary | ICD-10-CM | POA: Diagnosis not present

## 2022-12-27 DIAGNOSIS — E7800 Pure hypercholesterolemia, unspecified: Secondary | ICD-10-CM | POA: Diagnosis not present

## 2022-12-27 DIAGNOSIS — Z6832 Body mass index (BMI) 32.0-32.9, adult: Secondary | ICD-10-CM | POA: Diagnosis not present

## 2022-12-27 DIAGNOSIS — E559 Vitamin D deficiency, unspecified: Secondary | ICD-10-CM | POA: Diagnosis not present

## 2022-12-27 DIAGNOSIS — G8929 Other chronic pain: Secondary | ICD-10-CM | POA: Diagnosis not present

## 2022-12-27 DIAGNOSIS — M545 Low back pain, unspecified: Secondary | ICD-10-CM | POA: Diagnosis not present

## 2023-01-29 DIAGNOSIS — E7800 Pure hypercholesterolemia, unspecified: Secondary | ICD-10-CM | POA: Diagnosis not present

## 2023-01-29 DIAGNOSIS — E559 Vitamin D deficiency, unspecified: Secondary | ICD-10-CM | POA: Diagnosis not present

## 2023-01-29 DIAGNOSIS — Z79899 Other long term (current) drug therapy: Secondary | ICD-10-CM | POA: Diagnosis not present

## 2023-01-29 DIAGNOSIS — Z6832 Body mass index (BMI) 32.0-32.9, adult: Secondary | ICD-10-CM | POA: Diagnosis not present

## 2023-01-29 DIAGNOSIS — G8929 Other chronic pain: Secondary | ICD-10-CM | POA: Diagnosis not present

## 2023-01-29 DIAGNOSIS — M545 Low back pain, unspecified: Secondary | ICD-10-CM | POA: Diagnosis not present

## 2023-02-07 DIAGNOSIS — F32A Depression, unspecified: Secondary | ICD-10-CM | POA: Diagnosis not present

## 2023-02-07 DIAGNOSIS — R1310 Dysphagia, unspecified: Secondary | ICD-10-CM | POA: Diagnosis not present

## 2023-02-18 DIAGNOSIS — E559 Vitamin D deficiency, unspecified: Secondary | ICD-10-CM | POA: Diagnosis not present

## 2023-02-18 DIAGNOSIS — E7800 Pure hypercholesterolemia, unspecified: Secondary | ICD-10-CM | POA: Diagnosis not present

## 2023-02-18 DIAGNOSIS — Z6832 Body mass index (BMI) 32.0-32.9, adult: Secondary | ICD-10-CM | POA: Diagnosis not present

## 2023-02-18 DIAGNOSIS — M545 Low back pain, unspecified: Secondary | ICD-10-CM | POA: Diagnosis not present

## 2023-02-18 DIAGNOSIS — G8929 Other chronic pain: Secondary | ICD-10-CM | POA: Diagnosis not present

## 2023-02-27 DIAGNOSIS — H2512 Age-related nuclear cataract, left eye: Secondary | ICD-10-CM | POA: Diagnosis not present

## 2023-02-27 DIAGNOSIS — H25013 Cortical age-related cataract, bilateral: Secondary | ICD-10-CM | POA: Diagnosis not present

## 2023-02-27 DIAGNOSIS — H25043 Posterior subcapsular polar age-related cataract, bilateral: Secondary | ICD-10-CM | POA: Diagnosis not present

## 2023-02-27 DIAGNOSIS — H18413 Arcus senilis, bilateral: Secondary | ICD-10-CM | POA: Diagnosis not present

## 2023-02-27 DIAGNOSIS — H2513 Age-related nuclear cataract, bilateral: Secondary | ICD-10-CM | POA: Diagnosis not present

## 2023-04-02 DIAGNOSIS — M545 Low back pain, unspecified: Secondary | ICD-10-CM | POA: Diagnosis not present

## 2023-04-02 DIAGNOSIS — Z6832 Body mass index (BMI) 32.0-32.9, adult: Secondary | ICD-10-CM | POA: Diagnosis not present

## 2023-04-02 DIAGNOSIS — G8929 Other chronic pain: Secondary | ICD-10-CM | POA: Diagnosis not present

## 2023-04-02 DIAGNOSIS — E559 Vitamin D deficiency, unspecified: Secondary | ICD-10-CM | POA: Diagnosis not present

## 2023-04-02 DIAGNOSIS — E7800 Pure hypercholesterolemia, unspecified: Secondary | ICD-10-CM | POA: Diagnosis not present

## 2023-04-22 DIAGNOSIS — D0461 Carcinoma in situ of skin of right upper limb, including shoulder: Secondary | ICD-10-CM | POA: Diagnosis not present

## 2023-04-22 DIAGNOSIS — D0471 Carcinoma in situ of skin of right lower limb, including hip: Secondary | ICD-10-CM | POA: Diagnosis not present

## 2023-04-22 DIAGNOSIS — L820 Inflamed seborrheic keratosis: Secondary | ICD-10-CM | POA: Diagnosis not present

## 2023-05-01 DIAGNOSIS — E559 Vitamin D deficiency, unspecified: Secondary | ICD-10-CM | POA: Diagnosis not present

## 2023-05-01 DIAGNOSIS — Z6831 Body mass index (BMI) 31.0-31.9, adult: Secondary | ICD-10-CM | POA: Diagnosis not present

## 2023-05-01 DIAGNOSIS — G8929 Other chronic pain: Secondary | ICD-10-CM | POA: Diagnosis not present

## 2023-05-01 DIAGNOSIS — J069 Acute upper respiratory infection, unspecified: Secondary | ICD-10-CM | POA: Diagnosis not present

## 2023-05-01 DIAGNOSIS — E7800 Pure hypercholesterolemia, unspecified: Secondary | ICD-10-CM | POA: Diagnosis not present

## 2023-05-01 DIAGNOSIS — Z79899 Other long term (current) drug therapy: Secondary | ICD-10-CM | POA: Diagnosis not present

## 2023-05-01 DIAGNOSIS — E6609 Other obesity due to excess calories: Secondary | ICD-10-CM | POA: Diagnosis not present

## 2023-05-01 DIAGNOSIS — Z6832 Body mass index (BMI) 32.0-32.9, adult: Secondary | ICD-10-CM | POA: Diagnosis not present

## 2023-05-01 DIAGNOSIS — M545 Low back pain, unspecified: Secondary | ICD-10-CM | POA: Diagnosis not present

## 2023-05-28 DIAGNOSIS — H2512 Age-related nuclear cataract, left eye: Secondary | ICD-10-CM | POA: Diagnosis not present

## 2023-05-29 DIAGNOSIS — H2511 Age-related nuclear cataract, right eye: Secondary | ICD-10-CM | POA: Diagnosis not present

## 2023-05-29 DIAGNOSIS — H25041 Posterior subcapsular polar age-related cataract, right eye: Secondary | ICD-10-CM | POA: Diagnosis not present

## 2023-05-29 DIAGNOSIS — H25011 Cortical age-related cataract, right eye: Secondary | ICD-10-CM | POA: Diagnosis not present

## 2023-06-03 DIAGNOSIS — Z85828 Personal history of other malignant neoplasm of skin: Secondary | ICD-10-CM | POA: Diagnosis not present

## 2023-06-03 DIAGNOSIS — Z08 Encounter for follow-up examination after completed treatment for malignant neoplasm: Secondary | ICD-10-CM | POA: Diagnosis not present

## 2023-06-11 DIAGNOSIS — H2511 Age-related nuclear cataract, right eye: Secondary | ICD-10-CM | POA: Diagnosis not present

## 2023-06-26 DIAGNOSIS — Z6831 Body mass index (BMI) 31.0-31.9, adult: Secondary | ICD-10-CM | POA: Diagnosis not present

## 2023-06-26 DIAGNOSIS — M545 Low back pain, unspecified: Secondary | ICD-10-CM | POA: Diagnosis not present

## 2023-06-26 DIAGNOSIS — E6609 Other obesity due to excess calories: Secondary | ICD-10-CM | POA: Diagnosis not present

## 2023-06-26 DIAGNOSIS — E7800 Pure hypercholesterolemia, unspecified: Secondary | ICD-10-CM | POA: Diagnosis not present

## 2023-06-26 DIAGNOSIS — G8929 Other chronic pain: Secondary | ICD-10-CM | POA: Diagnosis not present

## 2023-06-26 DIAGNOSIS — R030 Elevated blood-pressure reading, without diagnosis of hypertension: Secondary | ICD-10-CM | POA: Diagnosis not present

## 2023-06-26 DIAGNOSIS — E559 Vitamin D deficiency, unspecified: Secondary | ICD-10-CM | POA: Diagnosis not present

## 2023-06-30 ENCOUNTER — Other Ambulatory Visit: Payer: Self-pay | Admitting: Orthopedic Surgery

## 2023-07-25 DIAGNOSIS — E7800 Pure hypercholesterolemia, unspecified: Secondary | ICD-10-CM | POA: Diagnosis not present

## 2023-07-25 DIAGNOSIS — E6609 Other obesity due to excess calories: Secondary | ICD-10-CM | POA: Diagnosis not present

## 2023-07-25 DIAGNOSIS — G8929 Other chronic pain: Secondary | ICD-10-CM | POA: Diagnosis not present

## 2023-07-25 DIAGNOSIS — R030 Elevated blood-pressure reading, without diagnosis of hypertension: Secondary | ICD-10-CM | POA: Diagnosis not present

## 2023-07-25 DIAGNOSIS — J209 Acute bronchitis, unspecified: Secondary | ICD-10-CM | POA: Diagnosis not present

## 2023-07-25 DIAGNOSIS — E559 Vitamin D deficiency, unspecified: Secondary | ICD-10-CM | POA: Diagnosis not present

## 2023-07-25 DIAGNOSIS — Z79899 Other long term (current) drug therapy: Secondary | ICD-10-CM | POA: Diagnosis not present

## 2023-07-25 DIAGNOSIS — Z6831 Body mass index (BMI) 31.0-31.9, adult: Secondary | ICD-10-CM | POA: Diagnosis not present

## 2023-07-25 DIAGNOSIS — M545 Low back pain, unspecified: Secondary | ICD-10-CM | POA: Diagnosis not present

## 2023-07-25 DIAGNOSIS — Z6830 Body mass index (BMI) 30.0-30.9, adult: Secondary | ICD-10-CM | POA: Diagnosis not present

## 2023-09-02 DIAGNOSIS — M545 Low back pain, unspecified: Secondary | ICD-10-CM | POA: Diagnosis not present

## 2023-09-02 DIAGNOSIS — Z79899 Other long term (current) drug therapy: Secondary | ICD-10-CM | POA: Diagnosis not present

## 2023-09-02 DIAGNOSIS — E6609 Other obesity due to excess calories: Secondary | ICD-10-CM | POA: Diagnosis not present

## 2023-09-02 DIAGNOSIS — E559 Vitamin D deficiency, unspecified: Secondary | ICD-10-CM | POA: Diagnosis not present

## 2023-09-02 DIAGNOSIS — Z6830 Body mass index (BMI) 30.0-30.9, adult: Secondary | ICD-10-CM | POA: Diagnosis not present

## 2023-09-02 DIAGNOSIS — R030 Elevated blood-pressure reading, without diagnosis of hypertension: Secondary | ICD-10-CM | POA: Diagnosis not present

## 2023-09-02 DIAGNOSIS — G8929 Other chronic pain: Secondary | ICD-10-CM | POA: Diagnosis not present

## 2023-09-02 DIAGNOSIS — E7800 Pure hypercholesterolemia, unspecified: Secondary | ICD-10-CM | POA: Diagnosis not present

## 2024-01-23 ENCOUNTER — Emergency Department (HOSPITAL_COMMUNITY)

## 2024-01-23 ENCOUNTER — Emergency Department (HOSPITAL_COMMUNITY)
Admission: EM | Admit: 2024-01-23 | Discharge: 2024-01-23 | Disposition: A | Discharge status: Home / Self Care | Attending: Emergency Medicine | Admitting: Emergency Medicine

## 2024-01-23 ENCOUNTER — Other Ambulatory Visit: Payer: Self-pay

## 2024-01-23 ENCOUNTER — Encounter (HOSPITAL_COMMUNITY): Payer: Self-pay | Admitting: *Deleted

## 2024-01-23 DIAGNOSIS — S8002XA Contusion of left knee, initial encounter: Secondary | ICD-10-CM | POA: Diagnosis not present

## 2024-01-23 DIAGNOSIS — Z7982 Long term (current) use of aspirin: Secondary | ICD-10-CM | POA: Diagnosis not present

## 2024-01-23 DIAGNOSIS — S335XXA Sprain of ligaments of lumbar spine, initial encounter: Secondary | ICD-10-CM | POA: Diagnosis not present

## 2024-01-23 DIAGNOSIS — M25512 Pain in left shoulder: Secondary | ICD-10-CM | POA: Diagnosis not present

## 2024-01-23 DIAGNOSIS — I700 Atherosclerosis of aorta: Secondary | ICD-10-CM | POA: Diagnosis not present

## 2024-01-23 DIAGNOSIS — K5730 Diverticulosis of large intestine without perforation or abscess without bleeding: Secondary | ICD-10-CM | POA: Insufficient documentation

## 2024-01-23 DIAGNOSIS — M545 Low back pain, unspecified: Secondary | ICD-10-CM | POA: Diagnosis present

## 2024-01-23 DIAGNOSIS — R519 Headache, unspecified: Secondary | ICD-10-CM | POA: Insufficient documentation

## 2024-01-23 LAB — COMPREHENSIVE METABOLIC PANEL WITH GFR
ALT: 10 U/L (ref 0–44)
AST: 16 U/L (ref 15–41)
Albumin: 3.6 g/dL (ref 3.5–5.0)
Alkaline Phosphatase: 75 U/L (ref 38–126)
Anion gap: 8 (ref 5–15)
BUN: 19 mg/dL (ref 8–23)
CO2: 25 mmol/L (ref 22–32)
Calcium: 9.1 mg/dL (ref 8.9–10.3)
Chloride: 105 mmol/L (ref 98–111)
Creatinine, Ser: 1.13 mg/dL — ABNORMAL HIGH (ref 0.44–1.00)
GFR, Estimated: 52 mL/min — ABNORMAL LOW (ref >60)
Glucose, Bld: 106 mg/dL — ABNORMAL HIGH (ref 70–99)
Potassium: 3.6 mmol/L (ref 3.5–5.1)
Sodium: 138 mmol/L (ref 135–145)
Total Bilirubin: 0.8 mg/dL (ref 0.0–1.2)
Total Protein: 7 g/dL (ref 6.5–8.1)

## 2024-01-23 LAB — CBC WITH DIFFERENTIAL/PLATELET
Abs Immature Granulocytes: 0.02 10*3/uL (ref 0.00–0.07)
Basophils Absolute: 0.1 10*3/uL (ref 0.0–0.1)
Basophils Relative: 1 %
Eosinophils Absolute: 0.2 10*3/uL (ref 0.0–0.5)
Eosinophils Relative: 3 %
HCT: 40.3 % (ref 36.0–46.0)
Hemoglobin: 12.9 g/dL (ref 12.0–15.0)
Immature Granulocytes: 0 %
Lymphocytes Relative: 37 %
Lymphs Abs: 3 10*3/uL (ref 0.7–4.0)
MCH: 29.9 pg (ref 26.0–34.0)
MCHC: 32 g/dL (ref 30.0–36.0)
MCV: 93.5 fL (ref 80.0–100.0)
Monocytes Absolute: 0.6 10*3/uL (ref 0.1–1.0)
Monocytes Relative: 7 %
Neutro Abs: 4.3 10*3/uL (ref 1.7–7.7)
Neutrophils Relative %: 52 %
Platelets: 245 10*3/uL (ref 150–400)
RBC: 4.31 MIL/uL (ref 3.87–5.11)
RDW: 12.9 % (ref 11.5–15.5)
WBC: 8.2 10*3/uL (ref 4.0–10.5)
nRBC: 0 % (ref 0.0–0.2)

## 2024-01-23 LAB — LIPASE, BLOOD: Lipase: 29 U/L (ref 11–51)

## 2024-01-23 MED ORDER — MORPHINE SULFATE (PF) 4 MG/ML IV SOLN
4.0000 mg | Freq: Once | INTRAVENOUS | Status: AC
  Administered 2024-01-23: 4 mg via INTRAVENOUS
  Filled 2024-01-23: qty 1

## 2024-01-23 MED ORDER — IOHEXOL 300 MG/ML  SOLN
100.0000 mL | Freq: Once | INTRAMUSCULAR | Status: AC | PRN
  Administered 2024-01-23: 100 mL via INTRAVENOUS

## 2024-01-23 MED ORDER — ONDANSETRON HCL 4 MG/2ML IJ SOLN
4.0000 mg | Freq: Once | INTRAMUSCULAR | Status: AC
  Administered 2024-01-23: 4 mg via INTRAVENOUS
  Filled 2024-01-23: qty 2
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
## 2024-01-23 NOTE — ED Notes (Signed)
 Pt states she has allergy to MRI contrast - developed hives; called CT to make them aware

## 2024-01-23 NOTE — ED Notes (Signed)
 Pt taken to xray
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
## 2024-01-23 NOTE — Discharge Instructions (Addendum)
 Apply ice for 30 minutes at a time, 4 times a day.  You may take acetaminophen and/or ibuprofen as needed for pain.  Please be aware that if you combine acetaminophen and ibuprofen, you will get better pain relief and you get from taking either medication by itself.
